# Patient Record
Sex: Female | Born: 1955 | Race: Black or African American | Hispanic: No | Marital: Married | Smoking: Never smoker
Health system: Southern US, Community
[De-identification: ages and names within clinical notes are randomized; demographics above are authoritative.]

## PROBLEM LIST (undated history)

## (undated) DIAGNOSIS — M069 Rheumatoid arthritis, unspecified: Secondary | ICD-10-CM

## (undated) DIAGNOSIS — M1990 Unspecified osteoarthritis, unspecified site: Secondary | ICD-10-CM

## (undated) DIAGNOSIS — Z8719 Personal history of other diseases of the digestive system: Secondary | ICD-10-CM

## (undated) DIAGNOSIS — Z8711 Personal history of peptic ulcer disease: Secondary | ICD-10-CM

## (undated) DIAGNOSIS — I10 Essential (primary) hypertension: Secondary | ICD-10-CM

## (undated) HISTORY — PX: TUBAL LIGATION: SHX77

## (undated) HISTORY — DX: Rheumatoid arthritis, unspecified: M06.9

## (undated) HISTORY — PX: JOINT REPLACEMENT: SHX530

---

## 2000-06-06 ENCOUNTER — Encounter: Payer: Self-pay | Admitting: Family Medicine

## 2000-06-06 ENCOUNTER — Ambulatory Visit (HOSPITAL_COMMUNITY): Admission: RE | Admit: 2000-06-06 | Discharge: 2000-06-06 | Payer: Self-pay | Admitting: Family Medicine

## 2002-04-07 ENCOUNTER — Emergency Department (HOSPITAL_COMMUNITY): Admission: EM | Admit: 2002-04-07 | Discharge: 2002-04-07 | Payer: Self-pay | Admitting: Emergency Medicine

## 2015-03-09 ENCOUNTER — Ambulatory Visit (INDEPENDENT_AMBULATORY_CARE_PROVIDER_SITE_OTHER): Payer: 59 | Admitting: Family Medicine

## 2015-03-09 ENCOUNTER — Ambulatory Visit (INDEPENDENT_AMBULATORY_CARE_PROVIDER_SITE_OTHER): Payer: 59

## 2015-03-09 VITALS — BP 156/90 | HR 77 | Temp 98.1°F | Resp 16 | Ht 64.0 in | Wt 154.0 lb

## 2015-03-09 DIAGNOSIS — M7989 Other specified soft tissue disorders: Secondary | ICD-10-CM | POA: Diagnosis not present

## 2015-03-09 DIAGNOSIS — M79642 Pain in left hand: Secondary | ICD-10-CM

## 2015-03-09 DIAGNOSIS — IMO0001 Reserved for inherently not codable concepts without codable children: Secondary | ICD-10-CM

## 2015-03-09 DIAGNOSIS — M25561 Pain in right knee: Secondary | ICD-10-CM

## 2015-03-09 DIAGNOSIS — Z1389 Encounter for screening for other disorder: Secondary | ICD-10-CM

## 2015-03-09 DIAGNOSIS — M25531 Pain in right wrist: Secondary | ICD-10-CM

## 2015-03-09 DIAGNOSIS — R030 Elevated blood-pressure reading, without diagnosis of hypertension: Secondary | ICD-10-CM

## 2015-03-09 DIAGNOSIS — M79641 Pain in right hand: Secondary | ICD-10-CM

## 2015-03-09 NOTE — Progress Notes (Signed)
Subjective:    Patient ID: Cindy Stevens, female    DOB: [birth year], 59 y.o.   MRN: [id number]  Chief Complaint  Patient presents with  . Joint Pain    X 3 weeks, bilateral hands and right knee  . Tingling    Off and on right foot   There are no active problems to display for this patient.  Prior to Admission medications   Not on File   Medications, allergies, past medical history, surgical history, family history, social history and problem list reviewed and updated.  HPI  76 yof with no significant pmh presents with bilateral hand pain, right knee pain, right big toe tingling.   Hand pain - started bilaterally approx 3 wks ago. Feels hands are stiff and tender 1st thing in morning, improves throughout day. Has tried aleve, advil with no relief. No trauma. She is a Programmer, applications for living, not doing any new tasks she hasn't done for many yrs. Feels that both hands are slightly weaker than normal. Pain is worst over knuckles.   Knee pain - Also started approx 3 wks ago. Has been bothering her constantly during past few wks. No trauma. No fever, chills. When she is at work on feet all day it actually improves, worst when she is at rest. Feels that pain is worst on inside of knee. No trauma to area. Aleve/advil/heating pad with no relief.   No fam hx oa, ra.   Also mentions that inside of her big toe has felt tingly past couple days. Comes and goes. No new shoes. No new activities. Denies pain, numbness, weakness to area. No other tingling sensation elsewhere. She is not overly concerned about this. Has had in the past and has resolved on own.   BP mildly elevated today. Pt states had stressful day at work and rushed here. Checks it occasionally at pharmacy and runs normal.   Review of Systems No cp, sob, fever, chills.     Objective:   Physical Exam  Constitutional: She is oriented to person, place, and time. She appears well-developed and well-nourished.  Non-toxic  appearance. She does not have a sickly appearance. She does not appear ill. No distress.  BP 156/90 mmHg  Pulse 77  Temp(Src) 98.1 F (36.7 C) (Oral)  Resp 16  Ht 5\' 4"  (1.626 m)  Wt 154 lb (69.854 kg)  BMI 26.42 kg/m2  SpO2 100%   Musculoskeletal:       Right knee: She exhibits normal range of motion, no swelling, no effusion, no LCL laxity, no bony tenderness, normal meniscus and no MCL laxity. Tenderness found. Medial joint line tenderness noted. No lateral joint line, no MCL, no LCL and no patellar tendon tenderness noted.       Right hand: She exhibits decreased range of motion, tenderness, bony tenderness and deformity. She exhibits normal capillary refill. Normal sensation noted. Decreased strength noted.       Hands: TTP over multiple PIP, MCP joints. No TTP over DIP joints. Mild swelling over 2nd, 3rd digit MCP joints. Decreased rom due to pain. Normal strength, normal sensation, normal cap refill. Right 5th digit with flexion deformity at DIP joint.   Left hand with mild TTP over MCP joints. Normal rom. Normal strength, sensation testing.   Right knee with ttp over medial joint line. No other ttp. Normal rom, strength, sensation. Neg lachmanns, neg valgus/varus testing. Neg grind test.   Neurological: She is alert and oriented to person, place, and time.  [facility] reading (PRIMARY) by Dr. Katrinka Blazing. Right hand findings: Degenerative change at DIP 5th joint with deviation. Otherwise normal.  Left hand findings: Normal.  Right knee findings: Mild degenerative changes medial knee. Patellar spurring.      Assessment & Plan:   84 yof with no significant pmh presents with bilateral hand pain, right knee pain, right big toe tingling.   Bilateral hand pain - Plan: DG Hand 2 View Right, DG Hand 2 View Left, Rheumatoid factor, Cyclic Citrul Peptide Antibody, IGG, Sedimentation rate, ANA Bilateral hand swelling - Plan: DG Hand 2 View Right, DG Hand 2 View Left, Rheumatoid factor, Cyclic  Citrul Peptide Antibody, IGG, Sedimentation rate, ANA --oa vs ra --xrays not concerning --> mild degenerative change at right 5th DIP, await ra labs --start with 500 tylenol q6, pt has only been using prn aleve/advil at this time --rtc if no relief with tylenol for further step up therapy --rtc if right medial big toe tingling persists  Right medial knee pain - Plan: DG Knee Complete 4 Views Right --mild degenerative changes --tylenol as above, rtc as above --await lab results  Elevated BP - Plan: Comprehensive metabolic panel --pt states typically runs 120s/80s, stressed from work today and rushed in here --rtc if running >140/90  Screening for nephropathy - Plan: Comprehensive metabolic panel --screen liver function prior to starting tylenol therapy for possible oa  Donnajean Lopes, PA-C Physician Assistant-Certified [facility]  03/09/2015 5:38 PM

## 2015-03-09 NOTE — Patient Instructions (Signed)
Your xrays showed some mild arthritis. We drew some labs to check for possible rheumatoid arthritis. I will let you know when these labs return.  Please start taking tylenol 500 mg every 4-6 hours for the arthritis. Be sure not to take more than 3000 mg total of tylenol. If your labs come back negative for RA, and if your pain persists despite the tylenol, please come back to see [place] for further management.   Arthritis, Nonspecific Arthritis is inflammation of a joint. This usually means pain, redness, warmth or swelling are present. One or more joints may be involved. There are a number of types of arthritis. Your caregiver may not be able to tell what type of arthritis you have right away. CAUSES  The most common cause of arthritis is the wear and tear on the joint (osteoarthritis). This causes damage to the cartilage, which can break down over time. The knees, hips, back and neck are most often affected by this type of arthritis. Other types of arthritis and common causes of joint pain include:  Sprains and other injuries near the joint. Sometimes minor sprains and injuries cause pain and swelling that develop hours later.  Rheumatoid arthritis. This affects hands, feet and knees. It usually affects both sides of your body at the same time. It is often associated with chronic ailments, fever, weight loss and general weakness.  Crystal arthritis. Gout and pseudo gout can cause occasional acute severe pain, redness and swelling in the foot, ankle, or knee.  Infectious arthritis. Bacteria can get into a joint through a break in overlying skin. This can cause infection of the joint. Bacteria and viruses can also spread through the blood and affect your joints.  Drug, infectious and allergy reactions. Sometimes joints can become mildly painful and slightly swollen with these types of illnesses. SYMPTOMS   Pain is the main symptom.  Your joint or joints can also be red, swollen and warm or hot to  the touch.  You may have a fever with certain types of arthritis, or even feel overall ill.  The joint with arthritis will hurt with movement. Stiffness is present with some types of arthritis. DIAGNOSIS  Your caregiver will suspect arthritis based on your description of your symptoms and on your exam. Testing may be needed to find the type of arthritis:  Blood and sometimes urine tests.  X-ray tests and sometimes CT or MRI scans.  Removal of fluid from the joint (arthrocentesis) is done to check for bacteria, crystals or other causes. Your caregiver (or a specialist) will numb the area over the joint with a local anesthetic, and use a needle to remove joint fluid for examination. This procedure is only minimally uncomfortable.  Even with these tests, your caregiver may not be able to tell what kind of arthritis you have. Consultation with a specialist (rheumatologist) may be helpful. TREATMENT  Your caregiver will discuss with you treatment specific to your type of arthritis. If the specific type cannot be determined, then the following general recommendations may apply. Treatment of severe joint pain includes:  Rest.  Elevation.  Anti-inflammatory medication (for example, ibuprofen) may be prescribed. Avoiding activities that cause increased pain.  Only take over-the-counter or prescription medicines for pain and discomfort as recommended by your caregiver.  Cold packs over an inflamed joint may be used for 10 to 15 minutes every hour. Hot packs sometimes feel better, but do not use overnight. Do not use hot packs if you are diabetic without your  caregiver's permission.  A cortisone shot into arthritic joints may help reduce pain and swelling.  Any acute arthritis that gets worse over the next 1 to 2 days needs to be looked at to be sure there is no joint infection. Long-term arthritis treatment involves modifying activities and lifestyle to reduce joint stress jarring. This can  include weight loss. Also, exercise is needed to nourish the joint cartilage and remove waste. This helps keep the muscles around the joint strong. HOME CARE INSTRUCTIONS   Do not take aspirin to relieve pain if gout is suspected. This elevates uric acid levels.  Only take over-the-counter or prescription medicines for pain, discomfort or fever as directed by your caregiver.  Rest the joint as much as possible.  If your joint is swollen, keep it elevated.  Use crutches if the painful joint is in your leg.  Drinking plenty of fluids may help for certain types of arthritis.  Follow your caregiver's dietary instructions.  Try low-impact exercise such as:  Swimming.  Water aerobics.  Biking.  Walking.  Morning stiffness is often relieved by a warm shower.  Put your joints through regular range-of-motion. SEEK MEDICAL CARE IF:   You do not feel better in 24 hours or are getting worse.  You have side effects to medications, or are not getting better with treatment. SEEK IMMEDIATE MEDICAL CARE IF:   You have a fever.  You develop severe joint pain, swelling or redness.  Many joints are involved and become painful and swollen.  There is severe back pain and/or leg weakness.  You have loss of bowel or bladder control. Document Released: 01/16/2005 Document Revised: 03/02/2012 Document Reviewed: 02/01/2009 [facility] Patient Information 2015 [place], [place]. This information is not intended to replace advice given to you by your health care provider. Make sure you discuss any questions you have with your health care provider.

## 2015-03-10 LAB — COMPREHENSIVE METABOLIC PANEL
ALT: 11 U/L (ref 0–35)
AST: 14 U/L (ref 0–37)
Albumin: 4 g/dL (ref 3.5–5.2)
Alkaline Phosphatase: 111 U/L (ref 39–117)
BUN: 14 mg/dL (ref 6–23)
CALCIUM: 9.1 mg/dL (ref 8.4–10.5)
CHLORIDE: 104 meq/L (ref 96–112)
CO2: 24 meq/L (ref 19–32)
Creat: 0.67 mg/dL (ref 0.50–1.10)
Glucose, Bld: 85 mg/dL (ref 70–99)
POTASSIUM: 4.2 meq/L (ref 3.5–5.3)
Sodium: 139 mEq/L (ref 135–145)
Total Bilirubin: 0.2 mg/dL (ref 0.2–1.2)
Total Protein: 8 g/dL (ref 6.0–8.3)

## 2015-03-10 LAB — RHEUMATOID FACTOR: Rhuematoid fact SerPl-aCnc: <10 IU/mL (ref <=14)

## 2015-03-10 NOTE — Progress Notes (Signed)
History and physical examinations reviewed in detail with PA McVeigh.  Xrays reviewed during visit.  Agree with assessment and plan. Cindy Stevens Paulita Fujita, M.D. [facility] [address] [place], [place]  [address] [phone] phone [phone] fax

## 2015-03-11 LAB — SEDIMENTATION RATE: Sed Rate: 49 mm/hr — ABNORMAL HIGH (ref 0–30)

## 2015-03-13 ENCOUNTER — Telehealth: Payer: Self-pay

## 2015-03-13 LAB — CYCLIC CITRUL PEPTIDE ANTIBODY, IGG: Cyclic Citrullin Peptide Ab: <2 U/mL (ref 0.0–5.0)

## 2015-03-13 NOTE — Telephone Encounter (Signed)
Dr. Katrinka Blazing, pt calling about labs. Please review. Thanks

## 2015-03-14 ENCOUNTER — Telehealth: Payer: Self-pay

## 2015-03-14 DIAGNOSIS — M150 Primary generalized (osteo)arthritis: Principal | ICD-10-CM

## 2015-03-14 DIAGNOSIS — M159 Polyosteoarthritis, unspecified: Secondary | ICD-10-CM

## 2015-03-14 LAB — ANA: Anti Nuclear Antibody(ANA): NEGATIVE

## 2015-03-14 NOTE — Telephone Encounter (Signed)
Patient is calling to speak to Dr. Neva Seat in regards to lab results. Please call! [phone]

## 2015-03-14 NOTE — Telephone Encounter (Signed)
Unable to reach pt

## 2015-03-15 NOTE — Telephone Encounter (Signed)
Bilateral hand swelling - Plan: DG Hand 2 View Right, DG Hand 2 View Left, Rheumatoid factor, Cyclic Citrul Peptide Antibody, IGG, Sedimentation rate, ANA --oa vs ra --xrays not concerning --> mild degenerative change at right 5th DIP, await ra labs --start with 500 tylenol q6, pt has only been using prn aleve/advil at this time --rtc if no relief with tylenol for further step up therapy --rtc if right medial big toe tingling persists  Can we answer her question about what could be causing the hand swelling? Please advise and can we refer to Dr. Kellie Simmering?

## 2015-03-15 NOTE — Telephone Encounter (Signed)
Patient called stating she would like a referral to see a doctor about her arthritis. She has a doctor in mind in [place], Dr. Stacey Drain at [address], since its close to her home. Can she get referral done? Cb# [id number].

## 2015-03-15 NOTE — Telephone Encounter (Signed)
Pt was read a letter about her lab results and was told that the swelling in her hand was not due to arthritis.  She wants to know if that is not what it is, what could be causing this?  Also can we call her in a prescription for the swelling?

## 2015-03-16 NOTE — Telephone Encounter (Signed)
Left msg with pt. Swelling most likely secondary to OA. Her RA labs were negative but she most likely has some osteoarthritis. Instructed her she should continue to take the tylenol for the swelling. I will gladly refer her to ortho for further management. I will refer to the listed physician.

## 2015-03-16 NOTE — Telephone Encounter (Signed)
Patient evaluated by Raelyn Ensign, PA-C. Will forward to PA McVeigh for management.

## 2015-03-16 NOTE — Telephone Encounter (Signed)
Patient advised of labs results on 03/15/15; labs reviewed by Raelyn Ensign, PA-C.

## 2016-08-22 ENCOUNTER — Other Ambulatory Visit: Payer: Self-pay | Admitting: Obstetrics and Gynecology

## 2016-08-22 ENCOUNTER — Other Ambulatory Visit (HOSPITAL_COMMUNITY)
Admission: RE | Admit: 2016-08-22 | Discharge: 2016-08-22 | Disposition: A | Discharge status: Home / Self Care | Payer: 59 | Source: Ambulatory Visit | Attending: Obstetrics and Gynecology | Admitting: Obstetrics and Gynecology

## 2016-08-22 DIAGNOSIS — Z01419 Encounter for gynecological examination (general) (routine) without abnormal findings: Secondary | ICD-10-CM | POA: Insufficient documentation

## 2016-08-22 DIAGNOSIS — Z1151 Encounter for screening for human papillomavirus (HPV): Secondary | ICD-10-CM | POA: Insufficient documentation

## 2016-08-27 LAB — CYTOLOGY - PAP

## 2016-11-16 ENCOUNTER — Ambulatory Visit (HOSPITAL_BASED_OUTPATIENT_CLINIC_OR_DEPARTMENT_OTHER)
Admission: RE | Admit: 2016-11-16 | Discharge: 2016-11-16 | Disposition: A | Discharge status: Home / Self Care | Payer: 59 | Source: Ambulatory Visit | Attending: Family Medicine | Admitting: Family Medicine

## 2016-11-16 ENCOUNTER — Ambulatory Visit (INDEPENDENT_AMBULATORY_CARE_PROVIDER_SITE_OTHER): Payer: 59

## 2016-11-16 ENCOUNTER — Telehealth: Payer: Self-pay

## 2016-11-16 ENCOUNTER — Telehealth: Payer: Self-pay | Admitting: Family Medicine

## 2016-11-16 ENCOUNTER — Ambulatory Visit (INDEPENDENT_AMBULATORY_CARE_PROVIDER_SITE_OTHER): Payer: 59 | Admitting: Family Medicine

## 2016-11-16 VITALS — BP 110/70 | HR 91 | Temp 98.6°F | Resp 17 | Ht 64.0 in | Wt 150.0 lb

## 2016-11-16 DIAGNOSIS — R0781 Pleurodynia: Secondary | ICD-10-CM | POA: Insufficient documentation

## 2016-11-16 DIAGNOSIS — R918 Other nonspecific abnormal finding of lung field: Secondary | ICD-10-CM | POA: Diagnosis not present

## 2016-11-16 LAB — POCT URINALYSIS DIP (MANUAL ENTRY)
BILIRUBIN UA: NEGATIVE
BILIRUBIN UA: NEGATIVE
Glucose, UA: NEGATIVE
NITRITE UA: NEGATIVE
Protein Ur, POC: 30 — AB
Spec Grav, UA: 1.025
Urobilinogen, UA: 1
pH, UA: 6

## 2016-11-16 LAB — POC MICROSCOPIC URINALYSIS (UMFC)

## 2016-11-16 MED ORDER — NAPROXEN 500 MG PO TABS: 500.0000 mg | ORAL_TABLET | Freq: Two times a day (BID) | ORAL | 0 refills | Status: DC

## 2016-11-16 MED ORDER — TRAMADOL HCL 50 MG PO TABS: 50.0000 mg | ORAL_TABLET | Freq: Three times a day (TID) | ORAL | 0 refills | Status: DC | PRN

## 2016-11-16 NOTE — Telephone Encounter (Signed)
I called to get PA on ct scan 11/16/16 and message states they are closed call Monday morning. Please do PA Monday.

## 2016-11-16 NOTE — Telephone Encounter (Addendum)
Received call from CT tech at [facility] regarding results for CT scan.  Dg Ribs Unilateral W/chest Left  Result Date: 11/16/2016 CLINICAL DATA:  60 year old female with left anterior rib pain EXAM: LEFT RIBS AND CHEST - 3+ VIEW COMPARISON:  None. FINDINGS: Cardiomediastinal silhouette within normal limits. No confluent airspace disease. Blunting of the left costophrenic angle, with the oblique images demonstrating thickening of the left pleura in the region of the fiducial marker. No displaced fracture. IMPRESSION: No evidence of displaced rib fracture. There is nonspecific thickening of the left-sided pleura at the costophrenic angle and lateral chest in the region of the fiducial marker, possibly pleural effusion or hemorrhage. Interval follow-up recommended with further evaluation with chest CT. These results were called by telephone at the time of interpretation on 11/16/2016 at 10:16 am to assistant for PA Ms Joaquin Courts , Mr Gavin Pound, who verbally acknowledged these results. Signed, Yvone Neu. Loreta Ave, DO [facility] Electronically Signed   By: Gilmer Mor D.O.   On: 11/16/2016 10:17   Ct Chest Wo Contrast  Result Date: 11/16/2016 CLINICAL DATA:  Pt with left lateral rib pain, NKI, pain with deep breath. EXAM: CT CHEST WITHOUT CONTRAST TECHNIQUE: Multidetector CT imaging of the chest was performed following the standard protocol without IV contrast. COMPARISON:  Chest x-ray from earlier same day. FINDINGS: Cardiovascular: Thoracic aorta is normal in caliber. Heart size is normal. No pericardial effusion. Mediastinum/Nodes: No mass or enlarged lymph nodes identified within the mediastinum, perihilar or axillary regions. Esophagus appears normal. Trachea and central bronchi are unremarkable. Lungs/Pleura: Peripheral consolidation at the left lung base, most prominent along the posterior medial aspects of the left lower lobe Vernona Rieger,  most likely atelectasis and chronic pleural thickening. Lungs otherwise clear bilaterally. Upper Abdomen: Limited images of the upper abdomen are unremarkable. Musculoskeletal: No acute or suspicious osseous finding. Superficial soft tissues are unremarkable. IMPRESSION: 1. Small peripheral consolidation at the left lung base, most suggestive of chronic benign pleural thickening and atelectasis, perhaps related to previous infectious or inflammatory process. Recommend follow-up chest CT in 3-6 months to ensure stability and confirm benignity. Lungs otherwise clear. 2. No evidence of acute intrathoracic abnormality. Left-sided ribs appear normal. Electronically Signed   By: Bary Richard M.D.   On: 11/16/2016 11:36

## 2016-11-16 NOTE — Progress Notes (Signed)
Patient ID: Cindy Stevens, female    DOB: November 07, 1956, 60 y.o.   MRN: [id number]  PCP: Joaquin Courts, FNP  Chief Complaint  Patient presents with  . Flank Pain    Left. NKI.     Subjective:   HPI 60 year old female presents for evaluation of left rib pain times 3 weeks. Patient has been previously seen here at [facility]. Reports aching, with occasional sharp pain upper section of left rib pain. Denies recent injury, falls or illness. Deep breathing and coughing increases rib pain. Pain produced when lying down. Last night she report worsening rib pain that was not relieved with anti-inflammatories.   Social History   Social History  . Marital status: Married    Spouse name: N/A  . Number of children: N/A  . Years of education: N/A   Occupational History  . Not on file.   Social History Main Topics  . Smoking status: Never Smoker  . Smokeless tobacco: Not on file  . Alcohol use No  . Drug use: No  . Sexual activity: Not on file   Other Topics Concern  . Not on file   Social History Narrative  . No narrative on file    Family History  Problem Relation Age of Onset  . Heart disease Sister   . Diabetes Sister    Review of Systems See HPI There are no active problems to display for this patient.    Prior to Admission medications   Medication Sig Start Date End Date Taking? Authorizing Provider  hydrochlorothiazide (HYDRODIURIL) 25 MG tablet Take 25 mg by mouth daily.   Yes Historical Provider, MD  No Known Allergies    Objective:  Physical Exam  Constitutional: She is oriented to person, place, and time. She appears well-developed and well-nourished.  Cardiovascular: Normal rate, regular rhythm and normal heart sounds.   Pulmonary/Chest: Effort normal and breath sounds normal. She exhibits no tenderness.  Unable to reproduce pain in left rib with deep palpation   Musculoskeletal: Normal range of motion.  Neurological: She is alert and oriented to  person, place, and time.  Skin: Skin is warm and dry.  Psychiatric: She has a normal mood and affect. Her behavior is normal. Judgment and thought content normal.    Vitals:   11/16/16 0857  BP: 110/70  Pulse: 91  Resp: 17  Temp: 98.6 F (37 C)   Dg Ribs Unilateral W/chest Left  Result Date: 11/16/2016 CLINICAL DATA:  60 year old female with left anterior rib pain EXAM: LEFT RIBS AND CHEST - 3+ VIEW COMPARISON:  None. FINDINGS: Cardiomediastinal silhouette within normal limits. No confluent airspace disease. Blunting of the left costophrenic angle, with the oblique images demonstrating thickening of the left pleura in the region of the fiducial marker. No displaced fracture. IMPRESSION: No evidence of displaced rib fracture. There is nonspecific thickening of the left-sided pleura at the costophrenic angle and lateral chest in the region of the fiducial marker, possibly pleural effusion or hemorrhage. Interval follow-up recommended with further evaluation with chest CT. These results were called by telephone at the time of interpretation on 11/16/2016 at 10:16 am to assistant for PA Ms Joaquin Courts , Mr Gavin Pound, who verbally acknowledged these results. Signed, Yvone Neu. Loreta Ave, DO [facility] Electronically Signed   By: Gilmer Mor D.O.   On: 11/16/2016 10:17   Ct Chest Wo Contrast  Result Date: 11/16/2016 CLINICAL DATA:  Pt with left lateral rib pain,  NKI, pain with deep breath. EXAM: CT CHEST WITHOUT CONTRAST TECHNIQUE: Multidetector CT imaging of the chest was performed following the standard protocol without IV contrast. COMPARISON:  Chest x-ray from earlier same day. FINDINGS: Cardiovascular: Thoracic aorta is normal in caliber. Heart size is normal. No pericardial effusion. Mediastinum/Nodes: No mass or enlarged lymph nodes identified within the mediastinum, perihilar or axillary regions. Esophagus appears normal. Trachea  and central bronchi are unremarkable. Lungs/Pleura: Peripheral consolidation at the left lung base, most prominent along the posterior medial aspects of the left lower lobe Vernona Rieger, most likely atelectasis and chronic pleural thickening. Lungs otherwise clear bilaterally. Upper Abdomen: Limited images of the upper abdomen are unremarkable. Musculoskeletal: No acute or suspicious osseous finding. Superficial soft tissues are unremarkable. IMPRESSION: 1. Small peripheral consolidation at the left lung base, most suggestive of chronic benign pleural thickening and atelectasis, perhaps related to previous infectious or inflammatory process. Recommend follow-up chest CT in 3-6 months to ensure stability and confirm benignity. Lungs otherwise clear. 2. No evidence of acute intrathoracic abnormality. Left-sided ribs appear normal. Electronically Signed   By: Bary Richard M.D.   On: 11/16/2016 11:36   Assessment & Plan:  1. Rib pain on left side - POCT urinalysis dipstick - POCT Microscopic Urinalysis ([facility]) - DG Ribs Unilateral W/Chest Left; Future  Plan: - CT Chest Wo Contrast, STAT  For pain management -Tramadol 50 mg, every 8 hours needed. - Ambulatory referral to Pulmonology, Urgent  Follow-up as needed.  Godfrey Pick. Tiburcio Pea, MSN, FNP-C [facility]

## 2016-11-16 NOTE — Patient Instructions (Addendum)
You have what is likely pleural effusion vs hemorrhage.  GO TO [facility] NOW FOR YOUR CT SCAN   [address] [place], [place] [address]   IF you received an x-ray today, you will receive an invoice from [facility]. Please contact [facility] at [phone] with questions or concerns regarding your invoice.   IF you received labwork today, you will receive an invoice from [employer]. Please contact [facility] at [phone] with questions or concerns regarding your invoice.   Our billing staff will not be able to assist you with questions regarding bills from these companies.  You will be contacted with the lab results as soon as they are available. The fastest way to get your results is to activate your My Chart account. Instructions are located on the last page of this paperwork. If you have not heard from [place] regarding the results in 2 weeks, please contact this office.     We recommend that you schedule a mammogram for breast cancer screening. Typically, you do not need a referral to do this. Please contact a local imaging center to schedule your mammogram.  [facility] - [phone]  *ask for the Radiology Department [facility]) - [phone] or [phone]  [facility] - [phone] [facility] - [phone] [facility] - [phone]  *ask for the Radiology Department [facility] - [phone]  *ask for the Radiology Department [facility] - [phone]  *ask for the Mammography Department [facility] - [phone]

## 2016-11-17 NOTE — Telephone Encounter (Deleted)
Spoke with Cindy Stevens and Cindy Stevens and advised of CT scan results showed nothing acute. Although chronic pleural thickening note along the base of the left lung. Advised I will treat acute left rib pain with anti-inflammatory, Naproxen 500 mg twice daily with food and Tramadol 50 mg every 8 hours as needed for pain. Will place an urgent referral to pulmonology for further evaluation.    Dg Ribs Unilateral W/chest Left  Result Date: 11/16/2016 CLINICAL DATA:  60 year old female with left anterior rib pain EXAM: LEFT RIBS AND CHEST - 3+ VIEW COMPARISON:  None. FINDINGS: Cardiomediastinal silhouette within normal limits. No confluent airspace disease. Blunting of the left costophrenic angle, with the oblique images demonstrating thickening of the left pleura in the region of the fiducial marker. No displaced fracture. IMPRESSION: No evidence of displaced rib fracture. There is nonspecific thickening of the left-sided pleura at the costophrenic angle and lateral chest in the region of the fiducial marker, possibly pleural effusion or hemorrhage. Interval follow-up recommended with further evaluation with chest CT. These results were called by telephone at the time of interpretation on 11/16/2016 at 10:16 am to assistant for PA Cindy Stevens Joaquin Courts , Mr Gavin Pound, who verbally acknowledged these results. Signed, Yvone Neu. Loreta Ave, DO [facility] Electronically Signed   By: Gilmer Mor D.O.   On: 11/16/2016 10:17   Ct Chest Wo Contrast  Result Date: 11/16/2016 CLINICAL DATA:  Pt with left lateral rib pain, NKI, pain with deep breath. EXAM: CT CHEST WITHOUT CONTRAST TECHNIQUE: Multidetector CT imaging of the chest was performed following the standard protocol without IV contrast. COMPARISON:  Chest x-ray from earlier same day. FINDINGS: Cardiovascular: Thoracic aorta is normal in caliber. Heart size is normal. No pericardial effusion. Mediastinum/Nodes: No  mass or enlarged lymph nodes identified within the mediastinum, perihilar or axillary regions. Esophagus appears normal. Trachea and central bronchi are unremarkable. Lungs/Pleura: Peripheral consolidation at the left lung base, most prominent along the posterior medial aspects of the left lower lobe Vernona Rieger, most likely atelectasis and chronic pleural thickening. Lungs otherwise clear bilaterally. Upper Abdomen: Limited images of the upper abdomen are unremarkable. Musculoskeletal: No acute or suspicious osseous finding. Superficial soft tissues are unremarkable. IMPRESSION: 1. Small peripheral consolidation at the left lung base, most suggestive of chronic benign pleural thickening and atelectasis, perhaps related to previous infectious or inflammatory process. Recommend follow-up chest CT in 3-6 months to ensure stability and confirm benignity. Lungs otherwise clear. 2. No evidence of acute intrathoracic abnormality. Left-sided ribs appear normal. Electronically Signed   By: Bary Richard M.D.   On: 11/16/2016 11:36

## 2016-11-18 NOTE — Telephone Encounter (Signed)
Submitted online, currently pending. Will request peer-to-peer if needed.

## 2016-11-19 NOTE — Telephone Encounter (Signed)
[employer] is requiring peer-to-peer review. Clinical needs to call [phone], reference case number [id number].

## 2016-11-19 NOTE — Telephone Encounter (Signed)
Cindy Stevens, Normally the RNs could handle providing additional clinical information, however, they prefer ordering provider do the Peer to Peer reviews.   Thanks,

## 2016-11-20 NOTE — Telephone Encounter (Signed)
[facility] Department  [phone]  Option #3  Correct number for peer to peer review. Placed on hold for 28 minutes and was routed to the incorrect department.

## 2016-11-22 ENCOUNTER — Encounter: Payer: Self-pay | Admitting: Pulmonary Disease

## 2016-11-22 ENCOUNTER — Ambulatory Visit (INDEPENDENT_AMBULATORY_CARE_PROVIDER_SITE_OTHER): Payer: 59 | Admitting: Pulmonary Disease

## 2016-11-22 VITALS — BP 128/74 | HR 86 | Ht 62.0 in | Wt 153.2 lb

## 2016-11-22 DIAGNOSIS — J929 Pleural plaque without asbestos: Secondary | ICD-10-CM

## 2016-11-22 DIAGNOSIS — M069 Rheumatoid arthritis, unspecified: Secondary | ICD-10-CM | POA: Diagnosis not present

## 2016-11-22 NOTE — Patient Instructions (Signed)
Will arrange for follow up CT chest in May 2018 and schedule follow up after this

## 2016-11-22 NOTE — Progress Notes (Signed)
   Subjective:    Patient ID: Cindy Stevens, female    DOB: [birth year], 60 y.o.   MRN: [id number]  HPI    Review of Systems  Constitutional: Negative for fever and unexpected weight change.  HENT: Negative for congestion, dental problem, ear pain, nosebleeds, postnasal drip, rhinorrhea, sinus pressure, sneezing, sore throat and trouble swallowing.   Eyes: Negative for redness and itching.  Respiratory: Negative for cough, chest tightness, shortness of breath and wheezing.   Cardiovascular: Negative for palpitations and leg swelling.  Gastrointestinal: Negative for nausea and vomiting.  Genitourinary: Negative for dysuria.  Musculoskeletal: Negative for joint swelling.  Skin: Negative for rash.  Neurological: Negative for headaches.  Hematological: Does not bruise/bleed easily.  Psychiatric/Behavioral: Negative for dysphoric mood. The patient is not nervous/anxious.        Objective:   Physical Exam        Assessment & Plan:

## 2016-11-22 NOTE — Progress Notes (Signed)
Past surgical history She  has no past surgical history on file.  Family history Her family history includes Diabetes in her sister; Heart disease in her sister.  Social history She  reports that she has never smoked. She has never used smokeless tobacco. She reports that she does not drink alcohol or use drugs.  No Known Allergies    Review of Systems  Constitutional: Negative for fever and unexpected weight change.  HENT: Negative for congestion, dental problem, ear pain, nosebleeds, postnasal drip, rhinorrhea, sinus pressure, sneezing, sore throat and trouble swallowing.   Eyes: Negative for redness and itching.  Respiratory: Negative for cough, chest tightness, shortness of breath and wheezing.   Cardiovascular: Negative for palpitations and leg swelling.  Gastrointestinal: Negative for nausea and vomiting.  Genitourinary: Negative for dysuria.  Musculoskeletal: Negative for joint swelling.  Skin: Negative for rash.  Neurological: Negative for headaches.  Hematological: Does not bruise/bleed easily.  Psychiatric/Behavioral: Negative for dysphoric mood. The patient is not nervous/anxious.     Current Outpatient Prescriptions on File Prior to Visit  Medication Sig  . hydrochlorothiazide (HYDRODIURIL) 25 MG tablet Take 25 mg by mouth daily.  . naproxen (NAPROSYN) 500 MG tablet Take 1 tablet (500 mg total) by mouth 2 (two) times daily with a meal.  . traMADol (ULTRAM) 50 MG tablet Take 1 tablet (50 mg total) by mouth every 8 (eight) hours as needed.   No current facility-administered medications on file prior to visit.     Chief Complaint  Patient presents with  . PULMONARY CONSULT    per Dr. Tiburcio Pea. pt c/o left rib pain X 2 wk. pt denies any sob, wheezing or chest discomfort.    Pulmonary tests CT chest 11/16/16 >> left lower lung pleural thickening  Past medical history She  has a past medical history of Rheumatoid arthritis (HCC).  Vital signs BP 128/74 (BP  Location: Left Arm, Cuff Size: Normal)   Pulse 86   Ht 5\' 2"  (1.575 m)   Wt 153 lb 3.2 oz (69.5 kg)   SpO2 100%   BMI 28.02 kg/m   History of present illness Cindy Stevens is a 60 y.o. female with left sided chest pain.  She has history of rheumatoid arthritis, and is followed by Dr. 67.  She is on an injectable biologic agent for this.  She developed pain in her left chest about 4 weeks ago.  It would hurt when she coughed or would take a deep breath.  She was not having fever, skin rash, sputum, hemoptysis, or wheezing.  She denies joint swelling.    She denies sick exposures.  She never smoked.  No history of pneumonia or exposure to tuberculosis.  She denies animal/bird exposures.  She had CT chest done which showed faint consolidation and pleural thickening in LLL.  She was started on naprosyn, and feels this has helped.   Physical exam  General - No distress ENT - No sinus tenderness, no oral exudate, no LAN, no thyromegaly, TM clear, pupils equal/reactive Cardiac - s1s2 regular, no murmur, pulses symmetric Chest - No wheeze/rales/dullness, good air entry, normal respiratory excursion Back - No focal tenderness Abd - Soft, non-tender, no organomegaly, + bowel sounds Ext - No edema Neuro - Normal strength, cranial nerves intact Skin - No rashes Psych - Normal mood, and behavior   CMP Latest Ref Rng & Units 03/09/2015  Glucose 70 - 99 mg/dL 85  BUN 6 - 23 mg/dL 14  Creatinine 03/11/2015 - 1.02  mg/dL 3.71  Sodium 696 - 789 mEq/L 139  Potassium 3.5 - 5.3 mEq/L 4.2  Chloride 96 - 112 mEq/L 104  CO2 19 - 32 mEq/L 24  Calcium 8.4 - 10.5 mg/dL 9.1  Total Protein 6.0 - 8.3 g/dL 8.0  Total Bilirubin 0.2 - 1.2 mg/dL 0.2  Alkaline Phos 39 - 117 U/L 111  AST 0 - 37 U/L 14  ALT 0 - 35 U/L 11    Discussion 60 yo female with Lt pleuritic chest pain, Lt lower lung pleural thickening and faint consolidation, and hx of rheumatoid arthritis.  She likely either had a viral  respiratory infection that caused pleuritis or this could be related to rheumatoid arthritis.  She reports symptomatic improvement with NSAID therpay.   Assessment/plan  Lt sided pleuritis. - continue NSAID therapy - she is to call if symptoms get worse, and then will give trial of prednisone - she will need f/u CT chest w/o contrast in 6 months   Patient Instructions  Will arrange for follow up CT chest in May 2018 and schedule follow up after this    Coralyn Helling, MD [facility] Pager:  [phone] 11/22/2016, 3:37 PM

## 2016-12-11 ENCOUNTER — Institutional Professional Consult (permissible substitution): Payer: 59 | Admitting: Pulmonary Disease

## 2017-04-22 ENCOUNTER — Telehealth: Payer: Self-pay | Admitting: Pulmonary Disease

## 2017-04-22 NOTE — Telephone Encounter (Signed)
I just spoke with Cindy Stevens about scheduling this 6 month follow up CT that Dr. Craige Cotta ordered for her. She stated that the pain that she was having is no longer there and that she feels that the CT is unnecessary.

## 2017-04-23 NOTE — Telephone Encounter (Signed)
Noted  

## 2017-08-22 ENCOUNTER — Other Ambulatory Visit: Payer: Self-pay | Admitting: Family Medicine

## 2017-08-22 ENCOUNTER — Ambulatory Visit
Admission: RE | Admit: 2017-08-22 | Discharge: 2017-08-22 | Disposition: A | Discharge status: Home / Self Care | Payer: BLUE CROSS/BLUE SHIELD | Source: Ambulatory Visit | Attending: Family Medicine | Admitting: Family Medicine

## 2017-08-22 DIAGNOSIS — Z01818 Encounter for other preprocedural examination: Secondary | ICD-10-CM

## 2017-09-26 ENCOUNTER — Ambulatory Visit: Payer: Self-pay | Admitting: Orthopedic Surgery

## 2017-09-30 ENCOUNTER — Ambulatory Visit: Payer: Self-pay | Admitting: Orthopedic Surgery

## 2017-09-30 NOTE — H&P (Signed)
TOTAL HIP ADMISSION H&P  Patient is admitted for left total hip arthroplasty.  Subjective:  Chief Complaint: left hip pain  HPI: Cindy Stevens, 61 y.o. 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  Onset of symptoms was gradual starting 2 years ago with gradually worsening course since that time.The patient noted no past surgery on the left hip(s).  Patient currently rates pain in the left hip at 10 out of 10 with activity. Patient has night pain, worsening of pain with activity and weight bearing, trendelenberg gait, pain that interfers with activities of daily living, pain with passive range of motion and crepitus. Patient has evidence of joint space narrowing and AVN with collapse by imaging studies. This condition presents safety issues increasing the risk of falls. This patient has had avascular necrosis of the hip.  There is no current active infection.  There are no active problems to display for this patient.  Past Medical History:  Diagnosis Date  . Rheumatoid arthritis (HCC)     No past surgical history on file.   (Not in a hospital admission) No Known Allergies  Social History  Substance Use Topics  . Smoking status: Never Smoker  . Smokeless tobacco: Never Used  . Alcohol use No    Family History  Problem Relation Age of Onset  . Heart disease Sister   . Diabetes Sister      Review of Systems  Constitutional: Negative.   HENT: Negative.   Eyes: Negative.   Respiratory: Negative.   Cardiovascular: Negative.   Gastrointestinal: Negative.   Genitourinary: Negative.   Musculoskeletal: Positive for joint pain.  Skin: Negative.   Neurological: Negative.   Endo/Heme/Allergies:  Negative.   Psychiatric/Behavioral: Negative.     Objective:  Physical Exam  Vitals reviewed. Constitutional: She is oriented to person, place, and time. She appears well-developed and well-nourished.  HENT:  Head: Normocephalic and atraumatic.  Eyes: Pupils are equal, round, and reactive to light. Conjunctivae and EOM are normal.  Neck: Normal range of motion. Neck supple.  Cardiovascular: Normal rate, regular rhythm and intact distal pulses.   Respiratory: Effort normal. No respiratory distress.  GI: Soft. She exhibits no distension.  Genitourinary:  Genitourinary Comments: deferred  Musculoskeletal:       Left hip: She exhibits decreased range of motion, decreased strength, bony tenderness and crepitus.  Neurological: She is alert and oriented to person, place, and time. She has normal reflexes.  Skin: Skin is warm and dry.  Psychiatric: She has a normal mood and affect. Her behavior is normal. Judgment and thought content normal.    Vital signs in last 24 hours: @VSRANGES @  Labs:   Estimated body mass index is 28.02 kg/m as calculated from the following:   Height as of 11/22/16: 5\' 2"  (1.575 m).   Weight as of 11/22/16: 69.5 kg (153 lb 3.2 oz).   Imaging Review Plain radiographs demonstrate severe degenerative joint disease of the left hip(s). The bone quality appears to be adequate for age and reported activity level.  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 The  treatment options including medical management, injection therapy, arthroscopy and arthroplasty were discussed at length. The risks and benefits of total hip arthroplasty were presented and reviewed. The risks due to aseptic loosening, infection, stiffness, dislocation/subluxation,   thromboembolic complications and other imponderables were discussed.  The patient acknowledged the explanation, agreed to proceed with the plan and consent was signed. Patient is being admitted for inpatient treatment for surgery, pain control, PT, OT, prophylactic antibiotics, VTE prophylaxis, progressive ambulation and ADL's and discharge planning.The patient is planning to be discharged home with home health services

## 2017-10-03 ENCOUNTER — Encounter (HOSPITAL_COMMUNITY): Payer: Self-pay

## 2017-10-03 ENCOUNTER — Encounter (HOSPITAL_COMMUNITY)
Admission: RE | Admit: 2017-10-03 | Discharge: 2017-10-03 | Disposition: A | Discharge status: Home / Self Care | Payer: BLUE CROSS/BLUE SHIELD | Source: Ambulatory Visit | Attending: Orthopedic Surgery | Admitting: Orthopedic Surgery

## 2017-10-03 DIAGNOSIS — M879 Osteonecrosis, unspecified: Secondary | ICD-10-CM | POA: Insufficient documentation

## 2017-10-03 DIAGNOSIS — Z0183 Encounter for blood typing: Secondary | ICD-10-CM | POA: Insufficient documentation

## 2017-10-03 DIAGNOSIS — Z01812 Encounter for preprocedural laboratory examination: Secondary | ICD-10-CM | POA: Diagnosis not present

## 2017-10-03 HISTORY — DX: Essential (primary) hypertension: I10

## 2017-10-03 LAB — TYPE AND SCREEN
ABO/RH(D): O POS
ANTIBODY SCREEN: NEGATIVE

## 2017-10-03 LAB — SURGICAL PCR SCREEN
MRSA, PCR: NEGATIVE
STAPHYLOCOCCUS AUREUS: NEGATIVE

## 2017-10-03 LAB — CBC
HEMATOCRIT: 32.5 % — AB (ref 36.0–46.0)
HEMOGLOBIN: 10.4 g/dL — AB (ref 12.0–15.0)
MCH: 26.4 pg (ref 26.0–34.0)
MCHC: 32 g/dL (ref 30.0–36.0)
MCV: 82.5 fL (ref 78.0–100.0)
Platelets: 242 10*3/uL (ref 150–400)
RBC: 3.94 MIL/uL (ref 3.87–5.11)
RDW: 13.3 % (ref 11.5–15.5)
WBC: 6.5 10*3/uL (ref 4.0–10.5)

## 2017-10-03 LAB — BASIC METABOLIC PANEL
ANION GAP: 9 (ref 5–15)
BUN: 15 mg/dL (ref 6–20)
CHLORIDE: 100 mmol/L — AB (ref 101–111)
CO2: 25 mmol/L (ref 22–32)
Calcium: 9.3 mg/dL (ref 8.9–10.3)
Creatinine, Ser: 0.68 mg/dL (ref 0.44–1.00)
Glucose, Bld: 86 mg/dL (ref 65–99)
POTASSIUM: 3.5 mmol/L (ref 3.5–5.1)
SODIUM: 134 mmol/L — AB (ref 135–145)

## 2017-10-03 LAB — ABO/RH: ABO/RH(D): O POS

## 2017-10-03 NOTE — Pre-Procedure Instructions (Signed)
Cindy Stevens  10/03/2017      [id number] Cindy Stevens, [place] - [address] Cindy Stevens [place] [address] Phone: [phone] Fax: [phone]    Your procedure is scheduled on October 13, 2017.  Report to [facility] Admitting at 530 AM.  Call this number if you have problems the morning of surgery:  [phone]   Remember:  Do not eat food or drink liquids after midnight.  Take these medicines the morning of surgery with A SIP OF WATER tramadol (ultram)-if needed for pain.  7 days prior to surgery STOP taking any celecoxib (celebrex),  Aspirin, Aleve, Naproxen, Ibuprofen, Motrin, Advil, Goody's, BC's, all herbal medications, fish oil, and all vitamins  Continue all other medications as instructed by your physician except follow the above medication instructions before surgery   Do not wear jewelry, make-up or nail polish.  Do not wear lotions, powders, or perfumes, or deoderant.  Do not shave 48 hours prior to surgery.    Do not bring valuables to the hospital.  [facility] is not responsible for any belongings or valuables.  Contacts, dentures or bridgework may not be worn into surgery.  Leave your suitcase in the car.  After surgery it may be brought to your room.  For patients admitted to the hospital, discharge time will be determined by your treatment team.  Patients discharged the day of surgery will not be allowed to drive home.   Special instructions:   [facility]- Preparing For Surgery  Before surgery, you can play an important role. Because skin is not sterile, your skin needs to be as free of germs as possible. You can reduce the number of germs on your skin by washing with CHG (chlorahexidine gluconate) Soap before surgery.  CHG is an antiseptic cleaner which kills germs and bonds with the skin to continue killing germs even after washing.  Please do not use if you have an allergy to CHG or  antibacterial soaps. If your skin becomes reddened/irritated stop using the CHG.  Do not shave (including legs and underarms) for at least 48 hours prior to first CHG shower. It is OK to shave your face.  Please follow these instructions carefully.   1. Shower the NIGHT BEFORE SURGERY and the MORNING OF SURGERY with CHG.   2. If you chose to wash your hair, wash your hair first as usual with your normal shampoo.  3. After you shampoo, rinse your hair and body thoroughly to remove the shampoo.  4. Use CHG as you would any other liquid soap. You can apply CHG directly to the skin and wash gently with a scrungie or a clean washcloth.   5. Apply the CHG Soap to your body ONLY FROM THE NECK DOWN.  Do not use on open wounds or open sores. Avoid contact with your eyes, ears, mouth and genitals (private parts). Wash Face and genitals (private parts)  with your normal soap.  6. Wash thoroughly, paying special attention to the area where your surgery will be performed.  7. Thoroughly rinse your body with warm water from the neck down.  8. DO NOT shower/wash with your normal soap after using and rinsing off the CHG Soap.  9. Pat yourself dry with a CLEAN TOWEL.  10. Wear CLEAN PAJAMAS to bed the night before surgery, wear comfortable clothes the morning of surgery  11. Place CLEAN SHEETS on your bed the night of your first shower and  DO NOT SLEEP WITH PETS.    Day of Surgery: Do not apply any deodorants/lotions. Please wear clean clothes to the hospital/surgery center.     Please read over the following fact sheets that you were given. Pain Booklet, Coughing and Deep Breathing, MRSA Information and Surgical Site Infection Prevention

## 2017-10-03 NOTE — Progress Notes (Addendum)
UJW:JXBJYN, Godfrey Pick, FNP  Cardiologist: pt denies  EKG: 08/2017 at Dr. Glendale Chard office-requested today   Stress test: pt denies  ECHO: pt denies  Cardiac Cath: pt denies  Chest x-ray: 08/23/2017 in [facility]

## 2017-10-07 ENCOUNTER — Encounter (HOSPITAL_COMMUNITY): Payer: Self-pay

## 2017-10-10 MED ORDER — TRANEXAMIC ACID 1000 MG/10ML IV SOLN
1000.0000 mg | INTRAVENOUS | Status: AC
  Administered 2017-10-13: 1000 mg via INTRAVENOUS
  Filled 2017-10-10: qty 10

## 2017-10-12 NOTE — Anesthesia Preprocedure Evaluation (Signed)
Anesthesia Evaluation  Patient identified by MRN, date of birth, ID band Patient awake    Reviewed: Allergy & Precautions, NPO status , Patient's Chart, lab work & pertinent test results  Airway Mallampati: II  TM Distance: >3 FB Neck ROM: Full    Dental no notable dental hx.    Pulmonary neg pulmonary ROS,    Pulmonary exam normal breath sounds clear to auscultation       Cardiovascular hypertension, Pt. on medications negative cardio ROS Normal cardiovascular exam Rhythm:Regular Rate:Normal     Neuro/Psych negative neurological ROS  negative psych ROS   GI/Hepatic negative GI ROS, Neg liver ROS,   Endo/Other  negative endocrine ROS  Renal/GU negative Renal ROS  negative genitourinary   Musculoskeletal negative musculoskeletal ROS (+) Arthritis , Rheumatoid disorders,    Abdominal   Peds negative pediatric ROS (+)  Hematology negative hematology ROS (+)   Anesthesia Other Findings   Reproductive/Obstetrics negative OB ROS                             Anesthesia Physical Anesthesia Plan  ASA: II  Anesthesia Plan: Spinal   Post-op Pain Management:    Induction:   PONV Risk Score and Plan: 2 and Ondansetron, Dexamethasone, Treatment may vary due to age or medical condition and Midazolam  Airway Management Planned: Nasal Cannula and Natural Airway  Additional Equipment:   Intra-op Plan:   Post-operative Plan:   Informed Consent:   Plan Discussed with:   Anesthesia Plan Comments: (  )        Anesthesia Quick Evaluation

## 2017-10-13 ENCOUNTER — Encounter (HOSPITAL_COMMUNITY)
Admission: RE | Disposition: A | Discharge status: Home / Self Care | Payer: Self-pay | Source: Ambulatory Visit | Attending: Orthopedic Surgery

## 2017-10-13 ENCOUNTER — Inpatient Hospital Stay (HOSPITAL_COMMUNITY): Payer: BLUE CROSS/BLUE SHIELD

## 2017-10-13 ENCOUNTER — Encounter (HOSPITAL_COMMUNITY): Payer: Self-pay

## 2017-10-13 ENCOUNTER — Inpatient Hospital Stay (HOSPITAL_COMMUNITY): Payer: BLUE CROSS/BLUE SHIELD | Admitting: Emergency Medicine

## 2017-10-13 ENCOUNTER — Inpatient Hospital Stay (HOSPITAL_COMMUNITY)
Admission: RE | Admit: 2017-10-13 | Discharge: 2017-10-14 | DRG: 470 | Disposition: A | Discharge status: Home / Self Care | Payer: BLUE CROSS/BLUE SHIELD | Source: Ambulatory Visit | Attending: Orthopedic Surgery | Admitting: Orthopedic Surgery

## 2017-10-13 ENCOUNTER — Inpatient Hospital Stay (HOSPITAL_COMMUNITY): Payer: BLUE CROSS/BLUE SHIELD | Admitting: Certified Registered Nurse Anesthetist

## 2017-10-13 DIAGNOSIS — Z419 Encounter for procedure for purposes other than remedying health state, unspecified: Secondary | ICD-10-CM

## 2017-10-13 DIAGNOSIS — Z79899 Other long term (current) drug therapy: Secondary | ICD-10-CM | POA: Diagnosis not present

## 2017-10-13 DIAGNOSIS — Z09 Encounter for follow-up examination after completed treatment for conditions other than malignant neoplasm: Secondary | ICD-10-CM

## 2017-10-13 DIAGNOSIS — M879 Osteonecrosis, unspecified: Principal | ICD-10-CM | POA: Diagnosis present

## 2017-10-13 DIAGNOSIS — I10 Essential (primary) hypertension: Secondary | ICD-10-CM | POA: Diagnosis present

## 2017-10-13 DIAGNOSIS — M1612 Unilateral primary osteoarthritis, left hip: Secondary | ICD-10-CM | POA: Diagnosis present

## 2017-10-13 DIAGNOSIS — M069 Rheumatoid arthritis, unspecified: Secondary | ICD-10-CM | POA: Diagnosis present

## 2017-10-13 DIAGNOSIS — Z8249 Family history of ischemic heart disease and other diseases of the circulatory system: Secondary | ICD-10-CM

## 2017-10-13 DIAGNOSIS — M87052 Idiopathic aseptic necrosis of left femur: Secondary | ICD-10-CM | POA: Diagnosis present

## 2017-10-13 HISTORY — PX: TOTAL HIP ARTHROPLASTY: SHX124

## 2017-10-13 HISTORY — DX: Personal history of other diseases of the digestive system: Z87.19

## 2017-10-13 HISTORY — DX: Personal history of peptic ulcer disease: Z87.11

## 2017-10-13 HISTORY — DX: Unspecified osteoarthritis, unspecified site: M19.90

## 2017-10-13 SURGERY — ARTHROPLASTY, HIP, TOTAL, ANTERIOR APPROACH
Anesthesia: Spinal | Laterality: Left

## 2017-10-13 MED ORDER — EPHEDRINE SULFATE-NACL 50-0.9 MG/10ML-% IV SOSY
PREFILLED_SYRINGE | INTRAVENOUS | Status: DC | PRN
  Administered 2017-10-13: 5 mg via INTRAVENOUS
  Administered 2017-10-13: 10 mg via INTRAVENOUS

## 2017-10-13 MED ORDER — SODIUM CHLORIDE 0.9 % IJ SOLN
INTRAMUSCULAR | Status: DC | PRN
  Administered 2017-10-13: 30 mL via INTRAVENOUS

## 2017-10-13 MED ORDER — LACTATED RINGERS IV SOLN
INTRAVENOUS | Status: DC | PRN
  Administered 2017-10-13 (×2): via INTRAVENOUS

## 2017-10-13 MED ORDER — SORBITOL 70 % SOLN: 30.0000 mL | Freq: Every day | Status: DC | PRN

## 2017-10-13 MED ORDER — METOCLOPRAMIDE HCL 5 MG/ML IJ SOLN
5.0000 mg | Freq: Three times a day (TID) | INTRAMUSCULAR | Status: DC | PRN
  Administered 2017-10-13: 10 mg via INTRAVENOUS
  Filled 2017-10-13: qty 2

## 2017-10-13 MED ORDER — SODIUM CHLORIDE 0.9 % IV SOLN
INTRAVENOUS | Status: DC
  Administered 2017-10-13: 12:00:00 via INTRAVENOUS

## 2017-10-13 MED ORDER — PHENOL 1.4 % MT LIQD: 1.0000 | OROMUCOSAL | Status: DC | PRN

## 2017-10-13 MED ORDER — HYDROCODONE-ACETAMINOPHEN 5-325 MG PO TABS
1.0000 | ORAL_TABLET | ORAL | Status: DC | PRN
  Administered 2017-10-14: 1 via ORAL
  Filled 2017-10-13 (×2): qty 1

## 2017-10-13 MED ORDER — BUPIVACAINE-EPINEPHRINE (PF) 0.5% -1:200000 IJ SOLN
INTRAMUSCULAR | Status: DC | PRN
  Administered 2017-10-13: 30 mL via PERINEURAL

## 2017-10-13 MED ORDER — PROPOFOL 500 MG/50ML IV EMUL
INTRAVENOUS | Status: DC | PRN
  Administered 2017-10-13: 50 ug/kg/min via INTRAVENOUS

## 2017-10-13 MED ORDER — KETOROLAC TROMETHAMINE 15 MG/ML IJ SOLN
7.5000 mg | Freq: Four times a day (QID) | INTRAMUSCULAR | Status: AC
  Administered 2017-10-13 – 2017-10-14 (×3): 7.5 mg via INTRAVENOUS
  Filled 2017-10-13 (×3): qty 1

## 2017-10-13 MED ORDER — MEPERIDINE HCL 25 MG/ML IJ SOLN: 6.2500 mg | INTRAMUSCULAR | Status: DC | PRN

## 2017-10-13 MED ORDER — BUPIVACAINE IN DEXTROSE 0.75-8.25 % IT SOLN
INTRATHECAL | Status: DC | PRN
  Administered 2017-10-13: 12 mg via INTRATHECAL

## 2017-10-13 MED ORDER — HYDROMORPHONE HCL 1 MG/ML IJ SOLN
INTRAMUSCULAR | Status: AC
  Administered 2017-10-13: 1 mg via INTRAVENOUS
  Filled 2017-10-13: qty 1

## 2017-10-13 MED ORDER — PROPOFOL 1000 MG/100ML IV EMUL
INTRAVENOUS | Status: AC
  Filled 2017-10-13: qty 100

## 2017-10-13 MED ORDER — ASPIRIN 81 MG PO CHEW
81.0000 mg | CHEWABLE_TABLET | Freq: Two times a day (BID) | ORAL | Status: DC
  Administered 2017-10-13 – 2017-10-14 (×2): 81 mg via ORAL
  Filled 2017-10-13 (×2): qty 1

## 2017-10-13 MED ORDER — ACETAMINOPHEN 650 MG RE SUPP: 650.0000 mg | RECTAL | Status: DC | PRN

## 2017-10-13 MED ORDER — 0.9 % SODIUM CHLORIDE (POUR BTL) OPTIME
TOPICAL | Status: DC | PRN
  Administered 2017-10-13: 1000 mL

## 2017-10-13 MED ORDER — TRANEXAMIC ACID 1000 MG/10ML IV SOLN
1000.0000 mg | Freq: Once | INTRAVENOUS | Status: AC
  Administered 2017-10-13: 1000 mg via INTRAVENOUS
  Filled 2017-10-13: qty 10

## 2017-10-13 MED ORDER — ONDANSETRON HCL 4 MG/2ML IJ SOLN: 4.0000 mg | Freq: Four times a day (QID) | INTRAMUSCULAR | Status: DC | PRN

## 2017-10-13 MED ORDER — EPHEDRINE 5 MG/ML INJ
INTRAVENOUS | Status: AC
  Filled 2017-10-13: qty 10

## 2017-10-13 MED ORDER — KETOROLAC TROMETHAMINE 30 MG/ML IJ SOLN
INTRAMUSCULAR | Status: AC
  Filled 2017-10-13: qty 1

## 2017-10-13 MED ORDER — FENTANYL CITRATE (PF) 100 MCG/2ML IJ SOLN
INTRAMUSCULAR | Status: DC | PRN
  Administered 2017-10-13: 25 ug via INTRAVENOUS
  Administered 2017-10-13: 50 ug via INTRAVENOUS

## 2017-10-13 MED ORDER — ACETAMINOPHEN 10 MG/ML IV SOLN
1000.0000 mg | INTRAVENOUS | Status: AC
  Administered 2017-10-13: 1000 mg via INTRAVENOUS
  Filled 2017-10-13: qty 100

## 2017-10-13 MED ORDER — DIPHENHYDRAMINE HCL 12.5 MG/5ML PO ELIX: 12.5000 mg | ORAL_SOLUTION | ORAL | Status: DC | PRN

## 2017-10-13 MED ORDER — ONDANSETRON HCL 4 MG PO TABS
4.0000 mg | ORAL_TABLET | Freq: Four times a day (QID) | ORAL | Status: DC | PRN
  Administered 2017-10-13: 4 mg via ORAL
  Filled 2017-10-13: qty 1

## 2017-10-13 MED ORDER — METOCLOPRAMIDE HCL 5 MG PO TABS: 5.0000 mg | ORAL_TABLET | Freq: Three times a day (TID) | ORAL | Status: DC | PRN

## 2017-10-13 MED ORDER — CEFAZOLIN SODIUM-DEXTROSE 2-4 GM/100ML-% IV SOLN
2.0000 g | Freq: Four times a day (QID) | INTRAVENOUS | Status: AC
  Administered 2017-10-13 (×2): 2 g via INTRAVENOUS
  Filled 2017-10-13 (×2): qty 100

## 2017-10-13 MED ORDER — PROPOFOL 10 MG/ML IV BOLUS
INTRAVENOUS | Status: DC | PRN
  Administered 2017-10-13: 20 mg via INTRAVENOUS

## 2017-10-13 MED ORDER — HYDROCHLOROTHIAZIDE 25 MG PO TABS
12.5000 mg | ORAL_TABLET | Freq: Every day | ORAL | Status: DC
  Administered 2017-10-14: 12.5 mg via ORAL
  Filled 2017-10-13: qty 1

## 2017-10-13 MED ORDER — MIDAZOLAM HCL 5 MG/5ML IJ SOLN
INTRAMUSCULAR | Status: DC | PRN
  Administered 2017-10-13: 2 mg via INTRAVENOUS

## 2017-10-13 MED ORDER — PROPOFOL 10 MG/ML IV BOLUS
INTRAVENOUS | Status: AC
  Filled 2017-10-13: qty 20

## 2017-10-13 MED ORDER — FENTANYL CITRATE (PF) 100 MCG/2ML IJ SOLN
INTRAMUSCULAR | Status: AC
  Filled 2017-10-13: qty 2

## 2017-10-13 MED ORDER — HYDROCODONE-ACETAMINOPHEN 5-325 MG PO TABS
2.0000 | ORAL_TABLET | ORAL | Status: DC | PRN
  Administered 2017-10-13 – 2017-10-14 (×2): 2 via ORAL
  Filled 2017-10-13: qty 2

## 2017-10-13 MED ORDER — CHLORHEXIDINE GLUCONATE 4 % EX LIQD: 60.0000 mL | Freq: Once | CUTANEOUS | Status: DC

## 2017-10-13 MED ORDER — METHOCARBAMOL 500 MG PO TABS
500.0000 mg | ORAL_TABLET | Freq: Four times a day (QID) | ORAL | Status: DC | PRN
  Administered 2017-10-13: 500 mg via ORAL

## 2017-10-13 MED ORDER — KETOROLAC TROMETHAMINE 30 MG/ML IJ SOLN
INTRAMUSCULAR | Status: DC | PRN
  Administered 2017-10-13: 30 mg via INTRAVENOUS

## 2017-10-13 MED ORDER — METHOCARBAMOL 1000 MG/10ML IJ SOLN
500.0000 mg | Freq: Four times a day (QID) | INTRAVENOUS | Status: DC | PRN
  Filled 2017-10-13: qty 5

## 2017-10-13 MED ORDER — FENTANYL CITRATE (PF) 250 MCG/5ML IJ SOLN
INTRAMUSCULAR | Status: AC
  Filled 2017-10-13: qty 5

## 2017-10-13 MED ORDER — SODIUM CHLORIDE 0.9 % IV SOLN
INTRAVENOUS | Status: AC | PRN
  Administered 2017-10-13: 1000 mL via INTRAMUSCULAR

## 2017-10-13 MED ORDER — FENTANYL CITRATE (PF) 100 MCG/2ML IJ SOLN
25.0000 ug | INTRAMUSCULAR | Status: DC | PRN
  Administered 2017-10-13 (×2): 50 ug via INTRAVENOUS
  Administered 2017-10-13 (×2): 25 ug via INTRAVENOUS

## 2017-10-13 MED ORDER — ACETAMINOPHEN 325 MG PO TABS
650.0000 mg | ORAL_TABLET | ORAL | Status: DC | PRN
  Administered 2017-10-14: 650 mg via ORAL
  Filled 2017-10-13: qty 2

## 2017-10-13 MED ORDER — CEFAZOLIN SODIUM-DEXTROSE 2-4 GM/100ML-% IV SOLN
2.0000 g | INTRAVENOUS | Status: AC
  Administered 2017-10-13: 2 g via INTRAVENOUS
  Filled 2017-10-13: qty 100

## 2017-10-13 MED ORDER — SENNA 8.6 MG PO TABS
2.0000 | ORAL_TABLET | Freq: Every day | ORAL | Status: DC
  Administered 2017-10-13: 17.2 mg via ORAL
  Filled 2017-10-13: qty 2

## 2017-10-13 MED ORDER — DEXAMETHASONE SODIUM PHOSPHATE 10 MG/ML IJ SOLN
10.0000 mg | Freq: Once | INTRAMUSCULAR | Status: AC
  Administered 2017-10-14: 10 mg via INTRAVENOUS
  Filled 2017-10-13: qty 1

## 2017-10-13 MED ORDER — MENTHOL 3 MG MT LOZG: 1.0000 | LOZENGE | OROMUCOSAL | Status: DC | PRN

## 2017-10-13 MED ORDER — POVIDONE-IODINE 10 % EX SWAB: 2.0000 "application " | Freq: Once | CUTANEOUS | Status: DC

## 2017-10-13 MED ORDER — PHENYLEPHRINE HCL 10 MG/ML IJ SOLN
INTRAMUSCULAR | Status: DC | PRN
  Administered 2017-10-13: 25 ug/min via INTRAVENOUS

## 2017-10-13 MED ORDER — SODIUM CHLORIDE 0.9 % IV SOLN: INTRAVENOUS | Status: DC

## 2017-10-13 MED ORDER — ALUM & MAG HYDROXIDE-SIMETH 200-200-20 MG/5ML PO SUSP: 30.0000 mL | ORAL | Status: DC | PRN

## 2017-10-13 MED ORDER — DOCUSATE SODIUM 100 MG PO CAPS
100.0000 mg | ORAL_CAPSULE | Freq: Two times a day (BID) | ORAL | Status: DC
  Administered 2017-10-13 – 2017-10-14 (×2): 100 mg via ORAL
  Filled 2017-10-13 (×2): qty 1

## 2017-10-13 MED ORDER — SODIUM CHLORIDE 0.9 % IR SOLN
Status: DC | PRN
  Administered 2017-10-13: 3000 mL

## 2017-10-13 MED ORDER — BUPIVACAINE-EPINEPHRINE (PF) 0.5% -1:200000 IJ SOLN
INTRAMUSCULAR | Status: AC
  Filled 2017-10-13: qty 30

## 2017-10-13 MED ORDER — MIDAZOLAM HCL 2 MG/2ML IJ SOLN
INTRAMUSCULAR | Status: AC
  Filled 2017-10-13: qty 2

## 2017-10-13 MED ORDER — METHOCARBAMOL 500 MG PO TABS
ORAL_TABLET | ORAL | Status: AC
  Filled 2017-10-13: qty 1

## 2017-10-13 MED ORDER — HYDROMORPHONE HCL 1 MG/ML IJ SOLN
0.5000 mg | INTRAMUSCULAR | Status: DC | PRN
  Administered 2017-10-13: 1 mg via INTRAVENOUS

## 2017-10-13 MED ORDER — FENTANYL CITRATE (PF) 100 MCG/2ML IJ SOLN
INTRAMUSCULAR | Status: AC
  Administered 2017-10-13: 25 ug via INTRAVENOUS
  Filled 2017-10-13: qty 2

## 2017-10-13 MED ORDER — POLYETHYLENE GLYCOL 3350 17 G PO PACK: 17.0000 g | PACK | Freq: Every day | ORAL | Status: DC | PRN

## 2017-10-13 MED ORDER — HYDROCODONE-ACETAMINOPHEN 5-325 MG PO TABS
ORAL_TABLET | ORAL | Status: AC
  Administered 2017-10-13: 2 via ORAL
  Filled 2017-10-13: qty 2

## 2017-10-13 MED ORDER — ONDANSETRON HCL 4 MG/2ML IJ SOLN: 4.0000 mg | Freq: Once | INTRAMUSCULAR | Status: DC | PRN

## 2017-10-13 SURGICAL SUPPLY — 51 items
ALCOHOL ISOPROPYL (RUBBING) (MISCELLANEOUS) ×3 IMPLANT
BLADE CLIPPER SURG (BLADE) IMPLANT
CAPT HIP TOTAL 2 ×3 IMPLANT
CHLORAPREP W/TINT 26ML (MISCELLANEOUS) ×3 IMPLANT
COVER SURGICAL LIGHT HANDLE (MISCELLANEOUS) ×3 IMPLANT
DERMABOND ADVANCED (GAUZE/BANDAGES/DRESSINGS) ×4
DERMABOND ADVANCED .7 DNX12 (GAUZE/BANDAGES/DRESSINGS) ×2 IMPLANT
DRAPE C-ARM 42X72 X-RAY (DRAPES) ×3 IMPLANT
DRAPE STERI IOBAN 125X83 (DRAPES) ×3 IMPLANT
DRAPE U-SHAPE 47X51 STRL (DRAPES) ×9 IMPLANT
DRSG AQUACEL AG ADV 3.5X10 (GAUZE/BANDAGES/DRESSINGS) ×3 IMPLANT
ELECT BLADE 4.0 EZ CLEAN MEGAD (MISCELLANEOUS) ×3
ELECT REM PT RETURN 9FT ADLT (ELECTROSURGICAL) ×3
ELECTRODE BLDE 4.0 EZ CLN MEGD (MISCELLANEOUS) ×1 IMPLANT
ELECTRODE REM PT RTRN 9FT ADLT (ELECTROSURGICAL) ×1 IMPLANT
EVACUATOR 1/8 PVC DRAIN (DRAIN) IMPLANT
GLOVE BIO SURGEON STRL SZ8.5 (GLOVE) ×6 IMPLANT
GLOVE BIOGEL PI IND STRL 8.5 (GLOVE) ×1 IMPLANT
GLOVE BIOGEL PI INDICATOR 8.5 (GLOVE) ×2
GOWN STRL REUS W/ TWL LRG LVL3 (GOWN DISPOSABLE) ×2 IMPLANT
GOWN STRL REUS W/TWL 2XL LVL3 (GOWN DISPOSABLE) ×3 IMPLANT
GOWN STRL REUS W/TWL LRG LVL3 (GOWN DISPOSABLE) ×4
HANDPIECE INTERPULSE COAX TIP (DISPOSABLE) ×2
HOOD PEEL AWAY FACE SHEILD DIS (HOOD) ×6 IMPLANT
KIT BASIN OR (CUSTOM PROCEDURE TRAY) ×3 IMPLANT
KIT ROOM TURNOVER OR (KITS) ×3 IMPLANT
MANIFOLD NEPTUNE II (INSTRUMENTS) ×3 IMPLANT
MARKER SKIN DUAL TIP RULER LAB (MISCELLANEOUS) ×6 IMPLANT
NEEDLE SPNL 18GX3.5 QUINCKE PK (NEEDLE) ×3 IMPLANT
NS IRRIG 1000ML POUR BTL (IV SOLUTION) ×3 IMPLANT
PACK TOTAL JOINT (CUSTOM PROCEDURE TRAY) ×3 IMPLANT
PACK UNIVERSAL I (CUSTOM PROCEDURE TRAY) ×3 IMPLANT
PAD ARMBOARD 7.5X6 YLW CONV (MISCELLANEOUS) ×6 IMPLANT
SAW OSC TIP CART 19.5X105X1.3 (SAW) ×3 IMPLANT
SEALER BIPOLAR AQUA 6.0 (INSTRUMENTS) IMPLANT
SET HNDPC FAN SPRY TIP SCT (DISPOSABLE) ×1 IMPLANT
SOL PREP POV-IOD 4OZ 10% (MISCELLANEOUS) ×3 IMPLANT
SUT ETHIBOND NAB CT1 #1 30IN (SUTURE) ×6 IMPLANT
SUT MNCRL AB 3-0 PS2 18 (SUTURE) ×3 IMPLANT
SUT MON AB 2-0 CT1 36 (SUTURE) ×3 IMPLANT
SUT VIC AB 1 CT1 27 (SUTURE) ×2
SUT VIC AB 1 CT1 27XBRD ANBCTR (SUTURE) ×1 IMPLANT
SUT VIC AB 2-0 CT1 27 (SUTURE) ×2
SUT VIC AB 2-0 CT1 TAPERPNT 27 (SUTURE) ×1 IMPLANT
SUT VLOC 180 0 24IN GS25 (SUTURE) ×3 IMPLANT
SYR 50ML LL SCALE MARK (SYRINGE) ×3 IMPLANT
TOWEL OR 17X24 6PK STRL BLUE (TOWEL DISPOSABLE) ×3 IMPLANT
TOWEL OR 17X26 10 PK STRL BLUE (TOWEL DISPOSABLE) ×3 IMPLANT
TRAY CATH 16FR W/PLASTIC CATH (SET/KITS/TRAYS/PACK) IMPLANT
TRAY FOLEY CATH SILVER 16FR (SET/KITS/TRAYS/PACK) IMPLANT
WATER STERILE IRR 1000ML POUR (IV SOLUTION) ×9 IMPLANT

## 2017-10-13 NOTE — Progress Notes (Signed)
PT Cancellation Note  Patient Details Name: Cindy Stevens MRN: [id number] DOB: 08-16-1956   Cancelled Treatment:    Reason Eval/Treat Not Completed: Medical issues which prohibited therapy Pt with nausea and vomiting and requesting to hold PT. Will reattempt as schedule allows.   Gladys Damme, PT, DPT  Acute Rehabilitation Services  Pager: [phone]    Lehman Prom 10/13/2017, 4:31 PM

## 2017-10-13 NOTE — Anesthesia Postprocedure Evaluation (Signed)
Anesthesia Post Note  Patient: Cindy Stevens  Procedure(s) Performed: TOTAL HIP ARTHROPLASTY ANTERIOR APPROACH (Left )     Patient location during evaluation: PACU Anesthesia Type: Spinal Level of consciousness: oriented and awake and alert Pain management: pain level controlled Vital Signs Assessment: post-procedure vital signs reviewed and stable Respiratory status: spontaneous breathing, respiratory function stable and patient connected to nasal cannula oxygen Cardiovascular status: blood pressure returned to baseline and stable Postop Assessment: no headache, no backache and no apparent nausea or vomiting Anesthetic complications: no    Last Vitals:  Vitals:   10/13/17 1400 10/13/17 1410  BP: 107/61   Pulse: (!) 58 62  Resp: 15 18  Temp:  36.7 C  SpO2: 99% 99%    Last Pain:  Vitals:   10/13/17 1345  TempSrc:   PainSc: 7                  Sherif Millspaugh

## 2017-10-13 NOTE — Interval H&P Note (Signed)
History and Physical Interval Note:  10/13/2017 7:30 AM  Cindy Stevens  has presented today for surgery, with the diagnosis of left hip avascular necrosis  The various methods of treatment have been discussed with the patient and family. After consideration of risks, benefits and other options for treatment, the patient has consented to  Procedure(s): TOTAL HIP ARTHROPLASTY ANTERIOR APPROACH (Left) as a surgical intervention .  The patient's history has been reviewed, patient examined, no change in status, stable for surgery.  I have reviewed the patient's chart and labs.  Questions were answered to the patient's satisfaction.     Joeline Freer, Cloyde Reams

## 2017-10-13 NOTE — Discharge Instructions (Signed)
°Dr. Daphyne Miguez °Joint Replacement Specialist °[facility] °[address] °[place], [place] [address] °[phone] ° ° °TOTAL HIP REPLACEMENT POSTOPERATIVE DIRECTIONS ° ° ° °Hip Rehabilitation, Guidelines Following Surgery  ° °WEIGHT BEARING °Weight bearing as tolerated with assist device (walker, cane, etc) as directed, use it as long as suggested by your surgeon or therapist, typically at least 4-6 weeks. ° °The results of a hip operation are greatly improved after range of motion and muscle strengthening exercises. Follow all safety measures which are given to protect your hip. If any of these exercises cause increased pain or swelling in your joint, decrease the amount until you are comfortable again. Then slowly increase the exercises. Call your caregiver if you have problems or questions.  ° °HOME CARE INSTRUCTIONS  °Most of the following instructions are designed to prevent the dislocation of your new hip.  °Remove items at home which could result in a fall. This includes throw rugs or furniture in walking pathways.  °Continue medications as instructed at time of discharge. °· You may have some home medications which will be placed on hold until you complete the course of blood thinner medication. °· You may start showering once you are discharged home. Do not remove your dressing. °Do not put on socks or shoes without following the instructions of your caregivers.   °Sit on chairs with arms. Use the chair arms to help push yourself up when arising.  °Arrange for the use of a toilet seat elevator so you are not sitting low.  °· Walk with walker as instructed.  °You may resume a sexual relationship in one month or when given the OK by your caregiver.  °Use walker as long as suggested by your caregivers.  °You may put full weight on your legs and walk as much as is comfortable. °Avoid periods of inactivity such as sitting longer than an hour when not asleep. This helps prevent  blood clots.  °You may return to work once you are cleared by your surgeon.  °Do not drive a car for 6 weeks or until released by your surgeon.  °Do not drive while taking narcotics.  °Wear elastic stockings for two weeks following surgery during the day but you may remove then at night.  °Make sure you keep all of your appointments after your operation with all of your doctors and caregivers. You should call the office at the above phone number and make an appointment for approximately two weeks after the date of your surgery. °Please pick up a stool softener and laxative for home use as long as you are requiring pain medications. °· ICE to the affected hip every three hours for 30 minutes at a time and then as needed for pain and swelling. Continue to use ice on the hip for pain and swelling from surgery. You may notice swelling that will progress down to the foot and ankle.  This is normal after surgery.  Elevate the leg when you are not up walking on it.   °It is important for you to complete the blood thinner medication as prescribed by your doctor. °· Continue to use the breathing machine which will help keep your temperature down.  It is common for your temperature to cycle up and down following surgery, especially at night when you are not up moving around and exerting yourself.  The breathing machine keeps your lungs expanded and your temperature down. ° °RANGE OF MOTION AND STRENGTHENING EXERCISES  °These exercises are   designed to help you keep full movement of your hip joint. Follow your caregiver's or physical therapist's instructions. Perform all exercises about fifteen times, three times per day or as directed. Exercise both hips, even if you have had only one joint replacement. These exercises can be done on a training (exercise) mat, on the floor, on a table or on a bed. Use whatever works the best and is most comfortable for you. Use music or television while you are exercising so that the exercises  are a pleasant break in your day. This will make your life better with the exercises acting as a break in routine you can look forward to.  °Lying on your back, slowly slide your foot toward your buttocks, raising your knee up off the floor. Then slowly slide your foot back down until your leg is straight again.  °Lying on your back spread your legs as far apart as you can without causing discomfort.  °Lying on your side, raise your upper leg and foot straight up from the floor as far as is comfortable. Slowly lower the leg and repeat.  °Lying on your back, tighten up the muscle in the front of your thigh (quadriceps muscles). You can do this by keeping your leg straight and trying to raise your heel off the floor. This helps strengthen the largest muscle supporting your knee.  °Lying on your back, tighten up the muscles of your buttocks both with the legs straight and with the knee bent at a comfortable angle while keeping your heel on the floor.  ° °SKILLED REHAB INSTRUCTIONS: °If the patient is transferred to a skilled rehab facility following release from the hospital, a list of the current medications will be sent to the facility for the patient to continue.  When discharged from the skilled rehab facility, please have the facility set up the patient's Home Health Physical Therapy prior to being released. Also, the skilled facility will be responsible for providing the patient with their medications at time of release from the facility to include their pain medication and their blood thinner medication. If the patient is still at the rehab facility at time of the two week follow up appointment, the skilled rehab facility will also need to assist the patient in arranging follow up appointment in our office and any transportation needs. ° °MAKE SURE YOU:  °Understand these instructions.  °Will watch your condition.  °Will get help right away if you are not doing well or get worse. ° °Pick up stool softner and  laxative for home use following surgery while on pain medications. °Do not remove your dressing. °The dressing is waterproof--it is OK to take showers. °Continue to use ice for pain and swelling after surgery. °Do not use any lotions or creams on the incision until instructed by your surgeon. °Total Hip Protocol. ° ° °

## 2017-10-13 NOTE — Progress Notes (Signed)
Pt's  Upper dentures returned to her in PACU

## 2017-10-13 NOTE — H&P (View-Only) (Signed)
TOTAL HIP ADMISSION H&P  Patient is admitted for left total hip arthroplasty.  Subjective:  Chief Complaint: left hip pain  HPI: Cindy Stevens, 61 y.o. 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  Onset of symptoms was gradual starting 2 years ago with gradually worsening course since that time.The patient noted no past surgery on the left hip(s).  Patient currently rates pain in the left hip at 10 out of 10 with activity. Patient has night pain, worsening of pain with activity and weight bearing, trendelenberg gait, pain that interfers with activities of daily living, pain with passive range of motion and crepitus. Patient has evidence of joint space narrowing and AVN with collapse by imaging studies. This condition presents safety issues increasing the risk of falls. This patient has had avascular necrosis of the hip.  There is no current active infection.  There are no active problems to display for this patient.  Past Medical History:  Diagnosis Date  . Rheumatoid arthritis (HCC)     No past surgical history on file.   (Not in a hospital admission) No Known Allergies  Social History  Substance Use Topics  . Smoking status: Never Smoker  . Smokeless tobacco: Never Used  . Alcohol use No    Family History  Problem Relation Age of Onset  . Heart disease Sister   . Diabetes Sister      Review of Systems  Constitutional: Negative.   HENT: Negative.   Eyes: Negative.   Respiratory: Negative.   Cardiovascular: Negative.   Gastrointestinal: Negative.   Genitourinary: Negative.   Musculoskeletal: Positive for joint pain.  Skin: Negative.   Neurological: Negative.   Endo/Heme/Allergies:  Negative.   Psychiatric/Behavioral: Negative.     Objective:  Physical Exam  Vitals reviewed. Constitutional: She is oriented to person, place, and time. She appears well-developed and well-nourished.  HENT:  Head: Normocephalic and atraumatic.  Eyes: Pupils are equal, round, and reactive to light. Conjunctivae and EOM are normal.  Neck: Normal range of motion. Neck supple.  Cardiovascular: Normal rate, regular rhythm and intact distal pulses.   Respiratory: Effort normal. No respiratory distress.  GI: Soft. She exhibits no distension.  Genitourinary:  Genitourinary Comments: deferred  Musculoskeletal:       Left hip: She exhibits decreased range of motion, decreased strength, bony tenderness and crepitus.  Neurological: She is alert and oriented to person, place, and time. She has normal reflexes.  Skin: Skin is warm and dry.  Psychiatric: She has a normal mood and affect. Her behavior is normal. Judgment and thought content normal.    Vital signs in last 24 hours: @VSRANGES @  Labs:   Estimated body mass index is 28.02 kg/m as calculated from the following:   Height as of 11/22/16: 5\' 2"  (1.575 m).   Weight as of 11/22/16: 69.5 kg (153 lb 3.2 oz).   Imaging Review Plain radiographs demonstrate severe degenerative joint disease of the left hip(s). The bone quality appears to be adequate for age and reported activity level.  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 The  treatment options including medical management, injection therapy, arthroscopy and arthroplasty were discussed at length. The risks and benefits of total hip arthroplasty were presented and reviewed. The risks due to aseptic loosening, infection, stiffness, dislocation/subluxation,   thromboembolic complications and other imponderables were discussed.  The patient acknowledged the explanation, agreed to proceed with the plan and consent was signed. Patient is being admitted for inpatient treatment for surgery, pain control, PT, OT, prophylactic antibiotics, VTE prophylaxis, progressive ambulation and ADL's and discharge planning.The patient is planning to be discharged home with home health services 

## 2017-10-13 NOTE — Evaluation (Signed)
Physical Therapy Evaluation Patient Details Name: Cindy Stevens MRN: [id number] DOB: Sep 22, 1956 Today's Date: 10/13/2017   History of Present Illness  Pt is a 61 y/o female s/p elective L THA, direct anterior approach. PMH includes RA.   Clinical Impression  Pt s/p surgery above with deficits below. PTA, pt was using cane for ambulation. Pt initially refused session, however, RN requested assist with taking pt to bathroom, therefore, pt agreeable to going to the bathroom. Was still feeling nauseous, therefore mobility limited to bathroom and back. Required min guard assist for mobility this session. Will need to review THA exercise handout with pt during next session, as pt refusing this session. Pt reports husband will be able to assist as needed upon d/c. Will need DME below. Follow up recommendations per MD arrangements. Will continue to follow acutely to maximize functional mobility independence and safety.     Follow Up Recommendations DC plan and follow up therapy as arranged by surgeon;Supervision for mobility/OOB    Equipment Recommendations  Rolling walker with 5" wheels;3in1 (PT)    Recommendations for Other Services       Precautions / Restrictions Precautions Precautions: None Precaution Comments: Pt with nausea and refusing ther ex at this time. Will need to review THA in next session.  Restrictions Weight Bearing Restrictions: Yes LLE Weight Bearing: Weight bearing as tolerated      Mobility  Bed Mobility Overal bed mobility: Needs Assistance Bed Mobility: Supine to Sit;Sit to Supine     Supine to sit: Supervision Sit to supine: Supervision   General bed mobility comments: Supervision for safety   Transfers Overall transfer level: Needs assistance Equipment used: Rolling walker (2 wheeled) Transfers: Sit to/from Stand Sit to Stand: Min guard         General transfer comment: Min guard for safety. Cues for safe hand placement.    Ambulation/Gait Ambulation/Gait assistance: Min guard Ambulation Distance (Feet): 15 Feet Assistive device: Rolling walker (2 wheeled) Gait Pattern/deviations: Step-to pattern;Decreased step length - right;Decreased step length - left;Decreased weight shift to left;Antalgic Gait velocity: Decreased Gait velocity interpretation: Below normal speed for age/gender General Gait Details: Slow, antalgic gait during ambulation to the bathroom. Required verbal cues for sequencing and safety cues to keep RW with her as pt was wanting to leave it behind. Verbal cues for proximity to device as well.   Stairs            Wheelchair Mobility    Modified Rankin (Stroke Patients Only)       Balance Overall balance assessment: Needs assistance Sitting-balance support: No upper extremity supported;Feet supported Sitting balance-Leahy Scale: Good     Standing balance support: Bilateral upper extremity supported;During functional activity Standing balance-Leahy Scale: Poor Standing balance comment: Reliant on RW for stability                              Pertinent Vitals/Pain Pain Assessment: 0-10 Pain Score: 6  Pain Location: L hip  Pain Descriptors / Indicators: Aching;Operative site guarding Pain Intervention(s): Limited activity within patient's tolerance;Monitored during session;Repositioned    Home Living Family/patient expects to be discharged to:: Private residence Living Arrangements: Spouse/significant other Available Help at Discharge: Family;Available 24 hours/day Type of Home: House Home Access: Stairs to enter Entrance Stairs-Rails: Doctor, general practice of Steps: 3 Home Layout: One level Home Equipment: Cane - single point      Prior Function Level of Independence: Independent with assistive device(s)  Comments: Reports she was using cane for ambulation     Hand Dominance        Extremity/Trunk Assessment   Upper  Extremity Assessment Upper Extremity Assessment: Overall WFL for tasks assessed    Lower Extremity Assessment Lower Extremity Assessment: LLE deficits/detail LLE Deficits / Details: Sensory in tact. Deficits consistent with post op pain and weakness. Able to perform ther ex below.     Cervical / Trunk Assessment Cervical / Trunk Assessment: Normal  Communication   Communication: No difficulties  Cognition Arousal/Alertness: Awake/alert Behavior During Therapy: WFL for tasks assessed/performed Overall Cognitive Status: Within Functional Limits for tasks assessed                                        General Comments General comments (skin integrity, edema, etc.): Pt's husband present during session     Exercises     Assessment/Plan    PT Assessment Patient needs continued PT services  PT Problem List Decreased strength;Decreased activity tolerance;Decreased mobility;Decreased balance;Decreased knowledge of use of DME;Decreased knowledge of precautions;Pain       PT Treatment Interventions DME instruction;Gait training;Stair training;Functional mobility training;Therapeutic activities;Therapeutic exercise;Balance training;Neuromuscular re-education;Patient/family education    PT Goals (Current goals can be found in the Care Plan section)  Acute Rehab PT Goals Patient Stated Goal: to feel better  PT Goal Formulation: With patient Time For Goal Achievement: 10/20/17 Potential to Achieve Goals: Good    Frequency 7X/week   Barriers to discharge        Co-evaluation               AM-PAC PT "6 Clicks" Daily Activity  Outcome Measure Difficulty turning over in bed (including adjusting bedclothes, sheets and blankets)?: None Difficulty moving from lying on back to sitting on the side of the bed? : None Difficulty sitting down on and standing up from a chair with arms (e.g., wheelchair, bedside commode, etc,.)?: Unable Help needed moving to and from a  bed to chair (including a wheelchair)?: A Little Help needed walking in hospital room?: A Little Help needed climbing 3-5 steps with a railing? : A Lot 6 Click Score: 17    End of Session   Activity Tolerance: Treatment limited secondary to medical complications (Comment) (nausea ) Patient left: in bed;with call bell/phone within reach;with family/visitor present Nurse Communication: Mobility status PT Visit Diagnosis: Other abnormalities of gait and mobility (R26.89);Pain Pain - Right/Left: Left Pain - part of body: Hip    Time: 8366-2947 PT Time Calculation (min) (ACUTE ONLY): 11 min   Charges:   PT Evaluation $PT Eval Low Complexity: 1 Low     PT G Codes:        Gladys Damme, PT, DPT  Acute Rehabilitation Services  Pager: [phone]   Lehman Prom 10/13/2017, 4:55 PM

## 2017-10-13 NOTE — Anesthesia Procedure Notes (Signed)
Procedure Name: MAC Date/Time: 10/13/2017 7:45 AM Performed by: Everlean Cherry A Pre-anesthesia Checklist: Patient identified, Emergency Drugs available, Suction available and Patient being monitored Patient Re-evaluated:Patient Re-evaluated prior to induction Oxygen Delivery Method: Simple face mask

## 2017-10-13 NOTE — Op Note (Addendum)
OPERATIVE REPORT  SURGEON: Samson Frederic, MD   ASSISTANT: Hart Carwin, RNFA.  PREOPERATIVE DIAGNOSIS: Left hip avascular necrosis.   POSTOPERATIVE DIAGNOSIS: Left hip avascular necrosis.   PROCEDURE: Left total hip arthroplasty, anterior approach.   IMPLANTS: DePuy Tri Lock stem, size 3, hi offset. DePuy Pinnacle Cup, size 50 mm. DePuy Altrx liner, size 32 by 50 mm, neutral. DePuy Biolox ceramic head ball, size 32 + 5 mm. 6.5 mm cancellous bone screw x1.  ANESTHESIA:  Spinal  ESTIMATED BLOOD LOSS:-300 mL    ANTIBIOTICS: 2 g Ancef.  DRAINS: None.  COMPLICATIONS: None.   CONDITION: PACU - hemodynamically stable.   BRIEF CLINICAL NOTE: Cindy Stevens is a 61 y.o. female with a long-standing history of Right hip avascular necrosis. After failing conservative management, the patient was indicated for total hip arthroplasty. The risks, benefits, and alternatives to the procedure were explained, and the patient elected to proceed.  PROCEDURE IN DETAIL: Surgical site was marked by myself in the pre-op holding area. Once inside the operating room, spinal anesthesia was obtained, and a foley catheter was inserted. The patient was then positioned on the Hana table. All bony prominences were well padded. The hip was prepped and draped in the normal sterile surgical fashion. A time-out was called verifying side and site of surgery. The patient received IV antibiotics within 60 minutes of beginning the procedure.  The direct anterior approach to the hip was performed through the Hueter interval. Lateral femoral circumflex vessels were treated with the Auqumantys. The anterior capsule was exposed and an inverted T capsulotomy was made.The femoral neck cut was made to the level of the templated cut. A corkscrew was placed into the head and the head was removed. The femoral head was found to be collapsed with delaminated cartilage and eburnated bone. The head was passed to the  back table and was measured.  Acetabular exposure was achieved, and the pulvinar and labrum were excised. Sequential reaming of the acetabulum was then performed up to a size 49 mm reamer. A 50 mm cup was then opened and impacted into place at approximately 40 degrees of abduction and 20 degrees of anteversion. I chose to augment the already acceptable press fit fixation with a single 6.5 mm cancellous bone screw. The final polyethylene liner was impacted into place and acetabular osteophytes were removed.   I then gained femoral exposure taking care to protect the abductors and greater trochanter. This was performed using standard external rotation, extension, and adduction. The capsule was peeled off the inner aspect of the greater trochanter, taking care to preserve the short external rotators. A cookie cutter was used to enter the femoral canal, and then the femoral canal finder was placed. Sequential broaching was performed up to a size 3. Calcar planer was used on the femoral neck remnant. I placed a hi offset neck and a trial head ball. The hip was reduced. Leg lengths and offset were checked fluoroscopically. Lengthening of a couple mm was required to achieve adequate soft tissue tension. The hip was dislocated and trial components were removed. The final implants were placed, and the hip was reduced.  Fluoroscopy was used to confirm component position and leg lengths. At 90 degrees of external rotation and full extension, the hip was stable to an anterior directed force.  The wound was copiously irrigated with normal saline using pulse lavage. Marcaine solution was injected into the periarticular soft tissue. The wound was closed in layers using #1 Vicryl and V-Loc for  the fascia, 2-0 Vicryl for the subcutaneous fat, 2-0 Monocryl for the deep dermal layer, 3-0 running Monocryl subcuticular stitch, and Dermabond for the skin. Once the glue was fully dried, an Aquacell Ag dressing was  applied. The patient was transported to the recovery room in stable condition. Sponge, needle, and instrument counts were correct at the end of the case x2. The patient tolerated the procedure well and there were no known complications.

## 2017-10-13 NOTE — Anesthesia Procedure Notes (Signed)
Spinal  Patient location during procedure: OR Start time: 10/13/2017 7:45 AM End time: 10/13/2017 7:47 AM Staffing Anesthesiologist: Yeni Jiggetts Preanesthetic Checklist Completed: patient identified, site marked, surgical consent, pre-op evaluation, timeout performed, IV checked, risks and benefits discussed and monitors and equipment checked Spinal Block Patient position: sitting Prep: DuraPrep Patient monitoring: heart rate, cardiac monitor, continuous pulse ox and blood pressure Approach: midline Location: L4-5 Injection technique: single-shot Needle Needle type: Sprotte  Needle gauge: 24 G Needle length: 9 cm Assessment Sensory level: T4

## 2017-10-13 NOTE — Transfer of Care (Signed)
Immediate Anesthesia Transfer of Care Note  Patient: NABIHA PLANCK  Procedure(s) Performed: TOTAL HIP ARTHROPLASTY ANTERIOR APPROACH (Left )  Patient Location: PACU  Anesthesia Type:Spinal  Level of Consciousness: awake, alert , drowsy and patient cooperative  Airway & Oxygen Therapy: Patient Spontanous Breathing and Patient connected to face mask oxygen  Post-op Assessment: Report given to RN and Post -op Vital signs reviewed and stable  Post vital signs: Reviewed and stable  Last Vitals:  Vitals:   10/13/17 0550  BP: (!) 143/75  Pulse: 71  Resp: 20  Temp: 36.6 C  SpO2: 100%    Last Pain:  Vitals:   10/13/17 0623  TempSrc:   PainSc: 4       Patients Stated Pain Goal: 2 (10/13/17 6659)  Complications: No apparent anesthesia complications

## 2017-10-14 ENCOUNTER — Encounter (HOSPITAL_COMMUNITY): Payer: Self-pay | Admitting: Orthopedic Surgery

## 2017-10-14 LAB — CBC
HEMATOCRIT: 29.3 % — AB (ref 36.0–46.0)
Hemoglobin: 9.4 g/dL — ABNORMAL LOW (ref 12.0–15.0)
MCH: 26.2 pg (ref 26.0–34.0)
MCHC: 32.1 g/dL (ref 30.0–36.0)
MCV: 81.6 fL (ref 78.0–100.0)
PLATELETS: 207 10*3/uL (ref 150–400)
RBC: 3.59 MIL/uL — AB (ref 3.87–5.11)
RDW: 13 % (ref 11.5–15.5)
WBC: 8.8 10*3/uL (ref 4.0–10.5)

## 2017-10-14 LAB — BASIC METABOLIC PANEL
ANION GAP: 8 (ref 5–15)
BUN: 5 mg/dL — ABNORMAL LOW (ref 6–20)
CO2: 26 mmol/L (ref 22–32)
Calcium: 8.3 mg/dL — ABNORMAL LOW (ref 8.9–10.3)
Chloride: 97 mmol/L — ABNORMAL LOW (ref 101–111)
Creatinine, Ser: 0.59 mg/dL (ref 0.44–1.00)
GFR calc Af Amer: >60 mL/min (ref >60)
GLUCOSE: 116 mg/dL — AB (ref 65–99)
POTASSIUM: 3.7 mmol/L (ref 3.5–5.1)
Sodium: 131 mmol/L — ABNORMAL LOW (ref 135–145)

## 2017-10-14 MED ORDER — SENNA 8.6 MG PO TABS: 2.0000 | ORAL_TABLET | Freq: Every day | ORAL | 0 refills | Status: DC

## 2017-10-14 MED ORDER — DOCUSATE SODIUM 100 MG PO CAPS: 100.0000 mg | ORAL_CAPSULE | Freq: Two times a day (BID) | ORAL | 1 refills | Status: DC

## 2017-10-14 MED ORDER — ONDANSETRON HCL 4 MG PO TABS: 4.0000 mg | ORAL_TABLET | Freq: Four times a day (QID) | ORAL | 0 refills | Status: DC | PRN

## 2017-10-14 MED ORDER — HYDROCODONE-ACETAMINOPHEN 5-325 MG PO TABS: 1.0000 | ORAL_TABLET | Freq: Four times a day (QID) | ORAL | 0 refills | Status: DC | PRN

## 2017-10-14 MED ORDER — ASPIRIN 81 MG PO CHEW: 81.0000 mg | CHEWABLE_TABLET | Freq: Two times a day (BID) | ORAL | 1 refills | Status: DC

## 2017-10-14 NOTE — Evaluation (Signed)
Occupational Therapy Evaluation Patient Details Name: Cindy Stevens MRN: [id number] DOB: 1956-07-09 Today's Date: 10/14/2017    History of Present Illness Pt is a 61 y/o female s/p elective L THA, direct anterior approach. PMH includes RA.    Clinical Impression   PTA, pt was living with husband and was independent. Currently, pt requires supervision for ADLs and functional mobility using RW. Provided education on LB ADLs, toilet transfer, and tub transfer with shower chair; pt demonstrated understanding. Answered all pt questions. Recommend dc home once medically stable per physician. All acute OT needs met and will sign off. Thank you.     Follow Up Recommendations  DC plan and follow up therapy as arranged by surgeon;Supervision/Assistance - 24 hour    Equipment Recommendations  None recommended by OT (Discussed with pt and husband shower chair)    Recommendations for Other Services PT consult     Precautions / Restrictions Precautions Precautions: None Restrictions Weight Bearing Restrictions: Yes LLE Weight Bearing: Weight bearing as tolerated      Mobility Bed Mobility Overal bed mobility: Modified Independent Bed Mobility: Supine to Sit;Sit to Supine     Supine to sit: Modified independent (Device/Increase time) Sit to supine: Modified independent (Device/Increase time)   General bed mobility comments: Bed set up to simulate home  Transfers Overall transfer level: Needs assistance Equipment used: Rolling walker (2 wheeled) Transfers: Sit to/from Stand Sit to Stand: Supervision         General transfer comment: Supervision for safety    Balance Overall balance assessment: Needs assistance Sitting-balance support: No upper extremity supported;Feet supported Sitting balance-Leahy Scale: Good     Standing balance support: Bilateral upper extremity supported Standing balance-Leahy Scale: Fair Standing balance comment: Reliant on RW for stability                             ADL either performed or assessed with clinical judgement   ADL Overall ADL's : Needs assistance/impaired Eating/Feeding: Set up;Sitting   Grooming: Set up;Standing;Supervision/safety   Upper Body Bathing: Set up;Supervision/ safety;Sitting   Lower Body Bathing: Set up;Supervison/ safety;Sit to/from stand   Upper Body Dressing : Set up;Sitting Upper Body Dressing Details (indicate cue type and reason): donned bra and shirt Lower Body Dressing: Set up;Supervision/safety;Sit to/from stand Lower Body Dressing Details (indicate cue type and reason): Provided education on LB dressing and compensatory techniques. Pt donning underwear and pants with supervision.   Toilet Transfer Details (indicate cue type and reason): Discussed using 3N1 over toilet to raise and provide handles.      Tub/ Shower Transfer: Tub transfer;Supervision/safety;Cueing for sequencing;Ambulation;Shower Technical sales engineer Details (indicate cue type and reason): Provided education on proper equipment to use at home and safe technique. Pt and husband verbalize understanding. Pt performed   General ADL Comments: Supervision for ADLs and functional mobility. Pt moving well and feel she will progress well with time. Provided education on LB dressing and tub transfer.     Vision         Perception     Praxis      Pertinent Vitals/Pain Pain Assessment: No/denies pain Pain Location: L hip  Pain Descriptors / Indicators: Aching;Operative site guarding Pain Intervention(s): Monitored during session     Hand Dominance Right   Extremity/Trunk Assessment Upper Extremity Assessment Upper Extremity Assessment: Overall WFL for tasks assessed   Lower Extremity Assessment Lower Extremity Assessment: Defer to PT evaluation LLE Deficits / Details:  Sensory in tact. Deficits consistent with post op pain and weakness. Able to perform ther ex below.    Cervical /  Trunk Assessment Cervical / Trunk Assessment: Normal   Communication Communication Communication: No difficulties   Cognition Arousal/Alertness: Awake/alert Behavior During Therapy: WFL for tasks assessed/performed Overall Cognitive Status: Within Functional Limits for tasks assessed                                     General Comments  Husband present throughout session    Exercises    Shoulder Instructions      Home Living Family/patient expects to be discharged to:: Private residence Living Arrangements: Spouse/significant other Available Help at Discharge: Family;Available 24 hours/day Type of Home: House Home Access: Stairs to enter [employer] of Steps: 3 Entrance Stairs-Rails: Right;Left Home Layout: One level     Bathroom Shower/Tub: Tub/shower unit;Door   [employer] Toilet: Standard     Home Equipment: Cane - single point          Prior Functioning/Environment Level of Independence: Independent with assistive device(s)        Comments: Reports she was using cane for ambulation        OT Problem List: Decreased range of motion;Impaired balance (sitting and/or standing);Decreased knowledge of use of DME or AE;Decreased knowledge of precautions;Pain      OT Treatment/Interventions:      OT Goals(Current goals can be found in the care plan section) Acute Rehab OT Goals Patient Stated Goal: to get home OT Goal Formulation: With patient Time For Goal Achievement: 10/28/17 Potential to Achieve Goals: Good  OT Frequency:     Barriers to D/C:            Co-evaluation              AM-PAC PT "6 Clicks" Daily Activity     Outcome Measure Help from another person eating meals?: None Help from another person taking care of personal grooming?: None Help from another person toileting, which includes using toliet, bedpan, or urinal?: A Little Help from another person bathing (including washing, rinsing, drying)?: A  Little Help from another person to put on and taking off regular upper body clothing?: None Help from another person to put on and taking off regular lower body clothing?: A Little 6 Click Score: 21   End of Session Equipment Utilized During Treatment: Rolling walker Nurse Communication: Mobility status  Activity Tolerance: Patient tolerated treatment well Patient left: in bed;with call bell/phone within reach  OT Visit Diagnosis: Unsteadiness on feet (R26.81);Pain Pain - Right/Left: Left Pain - part of body: Hip                Time: 2482-5003 OT Time Calculation (min): 25 min Charges:  OT General Charges $OT Visit: 1 Visit OT Evaluation $OT Eval Low Complexity: 1 Low OT Treatments $Self Care/Home Management : 8-22 mins G-Codes:     [place], OTR/L Acute Rehab Pager: [phone] Office: [place] 10/14/2017, 4:33 PM

## 2017-10-14 NOTE — Progress Notes (Signed)
   Subjective:  Patient reports pain as mild to moderate.  Had N/V - resolved. Denies Cp/SOB.  Objective:   VITALS:   Vitals:   10/13/17 1544 10/13/17 2048 10/14/17 0700 10/14/17 1100  BP: 132/67 117/65 119/66   Pulse: (!) 59 66 92   Resp: 16 17 17    Temp: (!) 97.5 F (36.4 C) 97.8 F (36.6 C) (!) 100.5 F (38.1 C)   TempSrc: Axillary Oral Oral   SpO2: 100% 100% 100%   Height:    5\' 3"  (1.6 m)    NAD ABD soft Sensation intact distally Intact pulses distally Dorsiflexion/Plantar flexion intact Incision: dressing C/D/I Compartment soft   Lab Results  Component Value Date   WBC 8.8 10/14/2017   HGB 9.4 (L) 10/14/2017   HCT 29.3 (L) 10/14/2017   MCV 81.6 10/14/2017   PLT 207 10/14/2017   BMET    Component Value Date/Time   NA 131 (L) 10/14/2017 0650   K 3.7 10/14/2017 0650   CL 97 (L) 10/14/2017 0650   CO2 26 10/14/2017 0650   GLUCOSE 116 (H) 10/14/2017 0650   BUN 5 (L) 10/14/2017 0650   CREATININE 0.59 10/14/2017 0650   CREATININE 0.67 03/09/2015 1747   CALCIUM 8.3 (L) 10/14/2017 0650   GFRNONAA >60 10/14/2017 0650   GFRAA >60 10/14/2017 0650     Assessment/Plan: 1 Day Post-Op   Principal Problem:   Osteoarthritis of left hip Active Problems:   Avascular necrosis of hip, left (HCC)   WBAT with walker DVT ppx: ASA, SCDs, TEDs PO pain control PT/OT Dispo: D/C home with HEP   Rainier Feuerborn, 10/16/2017 10/14/2017, 1:14 PM   Cloyde Reams, MD Cell [phone]

## 2017-10-14 NOTE — Progress Notes (Signed)
Discharge instructions and prescriptions/medications reviewed with patient and patient's husband. Verbalized understanding. Post-op instructions also reviewed. IV removed, catheter tip intact. No questions/concerns at this time. Patient awaiting transport to lobby for discharge.     10/14/17 1657  Vitals  Temp 98.7 F (37.1 C)  Temp Source Oral  BP 95/61  BP Location Right Arm  BP Method Automatic  Patient Position (if appropriate) Sitting  Pulse Rate 78  Pulse Rate Source Dinamap  Resp 16  Oxygen Therapy  SpO2 100 %  O2 Device Room Air

## 2017-10-14 NOTE — Progress Notes (Signed)
Physical Therapy Treatment Patient Details Name: Cindy Stevens MRN: [id number] DOB: 16-Apr-1956 Today's Date: 10/14/2017    History of Present Illness Pt is a 61 y/o female s/p elective L THA, direct anterior approach. PMH includes RA.     PT Comments    Pt is excited to go home. She required cues to stand without placing both hands on walker. Practiced safe sit to stand transfer with proper hand placement. Pt did better with wheeling walker. Was easily distracted and almost ran into several things. Required cues to focus on walker placement. Pt was more aware of stance time and was able to self correct more. Practiced steps. Pt required verbal cues for correct sequencing. Reviewed HEP and had pt perform all supine exercises. Explained rationale and reasons for several exercises upon pt request. Reviewed and demonstrated standing HEP exercises to pt and husband. Pt expressed concern about getting into the tub once home. Requested OT to instruct pt on tub transfer.   Follow Up Recommendations  DC plan and follow up therapy as arranged by surgeon;Supervision for mobility/OOB     Equipment Recommendations  Rolling walker with 5" wheels;3in1 (PT)    Recommendations for Other Services       Precautions / Restrictions Precautions Precautions: None Restrictions Weight Bearing Restrictions: Yes LLE Weight Bearing: Weight bearing as tolerated    Mobility  Bed Mobility Overal bed mobility: Modified Independent Bed Mobility: Supine to Sit;Sit to Supine     Supine to sit: Modified independent (Device/Increase time) Sit to supine: Modified independent (Device/Increase time)   General bed mobility comments: Pt required verbal cues to slow down with sit to stand.  Transfers Overall transfer level: Needs assistance Equipment used: Rolling walker (2 wheeled) Transfers: Sit to/from Stand Sit to Stand: Min guard         General transfer comment: Min guard due to impulsiveness.  Required cues for safe hand placement during sit to stand with walker.  Ambulation/Gait Ambulation/Gait assistance: Min guard Ambulation Distance (Feet): 730 Feet Assistive device: Rolling walker (2 wheeled) Gait Pattern/deviations: Step-through pattern;Decreased step length - right;Decreased step length - left;Decreased stride length;Decreased stance time - left Gait velocity: Decreased   General Gait Details: Pt was able to ambulate a greater distance. Required min cues to increase stance time on L and for equal step length. Pt was able to self correct resulting in shorter step length. Required cues to focus on where she was going and avoid running into walls with walker.    Stairs Stairs: Yes   Stair Management: Two rails;Step to pattern;Forwards Number of Stairs: 4 General stair comments: Instructed pt and family on proper sequencing for stairs. Pt. Was able to climb/descend stairs with modified independence.  Wheelchair Mobility    Modified Rankin (Stroke Patients Only)       Balance Overall balance assessment: Needs assistance Sitting-balance support: No upper extremity supported;Feet supported       Standing balance support: Bilateral upper extremity supported Standing balance-Leahy Scale: Fair Standing balance comment: Reliant on RW for stability                             Cognition Arousal/Alertness: Awake/alert Behavior During Therapy: WFL for tasks assessed/performed Overall Cognitive Status: Within Functional Limits for tasks assessed  Exercises Total Joint Exercises Ankle Circles/Pumps: AROM;Both;20 reps;Supine Quad Sets: AROM;Left;Supine;15 reps Towel Squeeze: AROM;Both;15 reps;Supine Short Arc Quad: AROM;Left;Supine;10 reps Heel Slides: AROM;Supine;15 reps;Left Hip ABduction/ADduction: AROM;Left;15 reps;Supine Long Arc Quad: AROM;Left;Seated;15 reps    General Comments         Pertinent Vitals/Pain Pain Assessment: No/denies pain Pain Intervention(s): Monitored during session    Home Living                      Prior Function            PT Goals (current goals can now be found in the care plan section) Acute Rehab PT Goals Patient Stated Goal: to get home PT Goal Formulation: With patient Time For Goal Achievement: 10/20/17 Potential to Achieve Goals: Good Progress towards PT goals: Progressing toward goals    Frequency    7X/week      PT Plan Current plan remains appropriate    Co-evaluation              AM-PAC PT "6 Clicks" Daily Activity  Outcome Measure  Difficulty turning over in bed (including adjusting bedclothes, sheets and blankets)?: None Difficulty moving from lying on back to sitting on the side of the bed? : None Difficulty sitting down on and standing up from a chair with arms (e.g., wheelchair, bedside commode, etc,.)?: A Little Help needed moving to and from a bed to chair (including a wheelchair)?: A Little Help needed walking in hospital room?: A Little Help needed climbing 3-5 steps with a railing? : A Little 6 Click Score: 20    End of Session Equipment Utilized During Treatment: Gait belt Activity Tolerance: Patient tolerated treatment well;No increased pain Patient left: in bed;with call bell/phone within reach;with family/visitor present Nurse Communication: Mobility status PT Visit Diagnosis: Other abnormalities of gait and mobility (R26.89) Pain - Right/Left: Left Pain - part of body: Hip     Time: 5537-4827 PT Time Calculation (min) (ACUTE ONLY): 35 min  Charges:  $Gait Training: 8-22 mins $Therapeutic Exercise: 8-22 mins                    G Codes:       Sharlene Motts, SPTA   Sharlene Motts 10/14/2017, 3:24 PM

## 2017-10-14 NOTE — Care Management Note (Signed)
Case Management Note  Patient Details  Name: Cindy Stevens MRN: [id number] Date of Birth: [birth year]  Subjective/Objective:   61 yr old female s/p left total hip arthroplasty, anterior approach.                 Action/Plan: Patient will not have HH services. Will follow the Home Exercise Program as outlined by Dr. Kathline Magic office. DME has been ordered, patient will have family support at discharge.   Expected Discharge Date:  10/14/17               Expected Discharge Plan:   (HEP)  In-House Referral:  NA  Discharge planning Services  CM Consult  Post Acute Care Choice:  Durable Medical Equipment Choice offered to:  NA  DME Arranged:  3-N-1, Walker rolling DME Agency:  [facility]  HH Arranged:  NA HH Agency:  NA  Status of Service:  Completed, signed off  If discussed at Long Length of Stay Meetings, dates discussed:    Additional Comments:  Durenda Guthrie, RN 10/14/2017, 3:22 PM

## 2017-10-14 NOTE — Discharge Summary (Signed)
Physician Discharge Summary  Patient ID: Cindy Stevens MRN: [id number] DOB/AGE: [birth year] 61 y.o.  Admit date: 10/13/2017 Discharge date: 10/14/2017  Admission Diagnoses:  Osteoarthritis of left hip  Discharge Diagnoses:  Principal Problem:   Osteoarthritis of left hip Active Problems:   Avascular necrosis of hip, left [facility])   Past Medical History:  Diagnosis Date  . Arthritis    "left knee; hands, shoulders" (10/13/2017)  . History of gastric ulcer   . Hypertension   . Rheumatoid arthritis ([place])     Surgeries: Procedure(s): TOTAL HIP ARTHROPLASTY ANTERIOR APPROACH on 10/13/2017   Consultants (if any):   Discharged Condition: Improved  Hospital Course: Cindy Stevens is an 61 y.o. female who was admitted 10/13/2017 with a diagnosis of Osteoarthritis of left hip and went to the operating room on 10/13/2017 and underwent the above named procedures.    She was given perioperative antibiotics:  Anti-infectives    Start     Dose/Rate Route Frequency Ordered Stop   10/13/17 1500  ceFAZolin (ANCEF) IVPB 2g/100 mL premix     2 g 200 mL/hr over 30 Minutes Intravenous Every 6 hours 10/13/17 1001 10/13/17 2255   10/13/17 0611  ceFAZolin (ANCEF) IVPB 2g/100 mL premix     2 g 200 mL/hr over 30 Minutes Intravenous On call to O.R. 10/13/17 5009 10/13/17 0740    .  She was given sequential compression devices, early ambulation, and ASA for DVT prophylaxis.  She benefited maximally from the hospital stay and there were no complications.    Recent vital signs:  Vitals:   10/13/17 2048 10/14/17 0700  BP: 117/65 119/66  Pulse: 66 92  Resp: 17 17  Temp: 97.8 F (36.6 C) (!) 100.5 F (38.1 C)  SpO2: 100% 100%    Recent laboratory studies:  Lab Results  Component Value Date   HGB 9.4 (L) 10/14/2017   HGB 10.4 (L) 10/03/2017   Lab Results  Component Value Date   WBC 8.8 10/14/2017   PLT 207 10/14/2017   No results found for: INR Lab Results  Component Value  Date   NA 131 (L) 10/14/2017   K 3.7 10/14/2017   CL 97 (L) 10/14/2017   CO2 26 10/14/2017   BUN 5 (L) 10/14/2017   CREATININE 0.59 10/14/2017   GLUCOSE 116 (H) 10/14/2017    Discharge Medications:   Allergies as of 10/14/2017   No Known Allergies     Medication List    STOP taking these medications   CIMZIA PREFILLED 2 X 200 MG/ML Kit Generic drug:  Certolizumab Pegol   traMADol 50 MG tablet Commonly known as:  ULTRAM     TAKE these medications   aspirin 81 MG chewable tablet Chew 1 tablet (81 mg total) by mouth 2 (two) times daily.   celecoxib 200 MG capsule Commonly known as:  CELEBREX Take 200 mg by mouth 2 (two) times daily as needed.   docusate sodium 100 MG capsule Commonly known as:  COLACE Take 1 capsule (100 mg total) by mouth 2 (two) times daily.   hydrochlorothiazide 12.5 MG tablet Commonly known as:  HYDRODIURIL Take 12.5 mg by mouth daily.   HYDROcodone-acetaminophen 5-325 MG tablet Commonly known as:  NORCO/VICODIN Take 1-2 tablets by mouth every 6 (six) hours as needed for moderate pain ((score 4 to 6)).   multivitamin with minerals tablet Take 1 tablet by mouth daily.   ondansetron 4 MG tablet Commonly known as:  ZOFRAN Take 1 tablet (4 mg total)  by mouth every 6 (six) hours as needed for nausea.   senna 8.6 MG Tabs tablet Commonly known as:  SENOKOT Take 2 tablets (17.2 mg total) by mouth at bedtime.       Diagnostic Studies: Dg Pelvis Portable  Result Date: 10/13/2017 CLINICAL DATA:  Status post total hip replacement on the left EXAM: PORTABLE PELVIS 1-2 VIEWS COMPARISON:  None. FINDINGS: Frontal view pelvis obtained. There is a total hip replacement on left with prosthetic components well-seated. No acute fracture or dislocation. There is slight narrowing of the right hip joint. IMPRESSION: Total hip replacement on the left with prosthetic components well-seated. No acute fracture or dislocation. Slight narrowing right hip joint.  Electronically Signed   By: Lowella Grip III M.D.   On: 10/13/2017 12:27   Dg C-arm 61-120 Min  Result Date: 10/13/2017 CLINICAL DATA:  Status post left total hip replacement EXAM: DG C-ARM 61-120 MIN; OPERATIVE LEFT HIP:  1 V COMPARISON:  None. FINDINGS: Frontal view obtained. There is a total hip replacement on the left with prosthetic components well-seated. No acute fracture or dislocation evident on this single view. There is mild narrowing of the right hip joint. IMPRESSION: No hip replacement on the left with prosthetic components well-seated on frontal view. No evident fracture or dislocation. Electronically Signed   By: Lowella Grip III M.D.   On: 10/13/2017 09:28   Dg Hip Operative Unilat W Or W/o Pelvis Left  Result Date: 10/13/2017 CLINICAL DATA:  Status post left total hip replacement EXAM: DG C-ARM 61-120 MIN; OPERATIVE LEFT HIP:  1 V COMPARISON:  None. FINDINGS: Frontal view obtained. There is a total hip replacement on the left with prosthetic components well-seated. No acute fracture or dislocation evident on this single view. There is mild narrowing of the right hip joint. IMPRESSION: No hip replacement on the left with prosthetic components well-seated on frontal view. No evident fracture or dislocation. Electronically Signed   By: Lowella Grip III M.D.   On: 10/13/2017 09:28    Disposition: Final discharge disposition not confirmed  Discharge Instructions    Call MD / Call 911    Complete by:  As directed    If you experience chest pain or shortness of breath, CALL 911 and be transported to the hospital emergency room.  If you develope a fever above 101 F, pus (white drainage) or increased drainage or redness at the wound, or calf pain, call your surgeon's office.   Constipation Prevention    Complete by:  As directed    Drink plenty of fluids.  Prune juice may be helpful.  You may use a stool softener, such as Colace (over the counter) 100 mg twice a day.  Use  MiraLax (over the counter) for constipation as needed.   Diet - low sodium heart healthy    Complete by:  As directed    Driving restrictions    Complete by:  As directed    No driving for 6 weeks   Increase activity slowly as tolerated    Complete by:  As directed    Lifting restrictions    Complete by:  As directed    No lifting for 6 weeks   TED hose    Complete by:  As directed    Use stockings (TED hose) for 2 weeks on both leg(s).  You may remove them at night for sleeping.      Follow-up Information    Kynadi Dragos, Aaron Edelman, MD. Schedule an appointment as  soon as possible for a visit in 2 weeks.   Specialty:  Orthopedic Surgery Why:  For wound re-check Contact information: [address]. [address] [place] [address] [phone]            Signed: Elie Goody 10/14/2017, 1:17 PM

## 2017-10-14 NOTE — Progress Notes (Signed)
Physical Therapy Treatment Patient Details Name: Cindy Stevens MRN: [id number] DOB: [birth year] Today's Date: 10/14/2017    History of Present Illness Pt is a 61 y/o female s/p elective L THA, direct anterior approach. PMH includes RA.     PT Comments    Pt. Was eager to participate in therapy as she was feeling much better than yesterday. Eagerness increased pt's impulsiveness; required cues to slow down. She was able to ambulate further than yesterday. Required cues to increase stance time on L. Pt. Was able to correct with focused attention. Reviewed supine exercises from HEP. Patient was able to perform with cues for speed and proper form. Pt. Was given opportunity to ask questions about HEP. Did not have any at this time. Pt. Requested to remain seated in chair.   Follow Up Recommendations  DC plan and follow up therapy as arranged by surgeon;Supervision for mobility/OOB     Equipment Recommendations  Rolling walker with 5" wheels;3in1 (PT)    Recommendations for Other Services       Precautions / Restrictions Precautions Precautions: None Precaution Comments: Pt. presented with no nausea and was able to participate in PT. Restrictions Weight Bearing Restrictions: Yes LLE Weight Bearing: Weight bearing as tolerated    Mobility  Bed Mobility Overal bed mobility: Modified Independent Bed Mobility: Supine to Sit     Supine to sit: Modified independent (Device/Increase time)     General bed mobility comments: Pt impulsive with sit to stand.   Transfers Overall transfer level: Needs assistance Equipment used: Rolling walker (2 wheeled) Transfers: Sit to/from Stand Sit to Stand: Min guard         General transfer comment: Min guard due to impulsiveness.  Ambulation/Gait Ambulation/Gait assistance: Min guard Ambulation Distance (Feet): 365 Feet Assistive device: Rolling walker (2 wheeled) Gait Pattern/deviations: Step-through pattern;Decreased stance time -  left;Decreased step length - right Gait velocity: Decreased Gait velocity interpretation: Below normal speed for age/gender General Gait Details: Pt. was able to ambulation a greater distance. Required cues to increase stance time on L and for equal step length. Pt. was able to use step through gait pattern and stayed within correct proximity to device.    Stairs            Wheelchair Mobility    Modified Rankin (Stroke Patients Only)       Balance Overall balance assessment: Needs assistance Sitting-balance support: No upper extremity supported;Feet supported Sitting balance-Leahy Scale: Good     Standing balance support: Bilateral upper extremity supported Standing balance-Leahy Scale: Fair Standing balance comment: Reliant on RW for stability                             Cognition Arousal/Alertness: Awake/alert Behavior During Therapy: WFL for tasks assessed/performed;Impulsive Overall Cognitive Status: Within Functional Limits for tasks assessed                                        Exercises Total Joint Exercises Ankle Circles/Pumps: AROM;Both;20 reps Quad Sets: AROM;Left;10 reps;Supine Short Arc Quad: AROM;Left;10 reps;Supine Heel Slides: AROM;Seated Hip ABduction/ADduction: AROM;Left;15 reps Long Arc Quad: AROM;Seated;Left;15 reps    General Comments        Pertinent Vitals/Pain Pain Assessment: No/denies pain (Had just taken pain meds) Pain Score: 0-No pain Pain Intervention(s): Monitored during session;Ice applied    Home Living  Prior Function            PT Goals (current goals can now be found in the care plan section) Acute Rehab PT Goals Patient Stated Goal: to get home PT Goal Formulation: With patient Time For Goal Achievement: 10/20/17 Potential to Achieve Goals: Good Progress towards PT goals: Progressing toward goals    Frequency    7X/week      PT Plan Current plan  remains appropriate    Co-evaluation              AM-PAC PT "6 Clicks" Daily Activity  Outcome Measure  Difficulty turning over in bed (including adjusting bedclothes, sheets and blankets)?: None Difficulty moving from lying on back to sitting on the side of the bed? : None Difficulty sitting down on and standing up from a chair with arms (e.g., wheelchair, bedside commode, etc,.)?: A Little Help needed moving to and from a bed to chair (including a wheelchair)?: A Little Help needed walking in hospital room?: A Little Help needed climbing 3-5 steps with a railing? : A Lot 6 Click Score: 19    End of Session Equipment Utilized During Treatment: Gait belt Activity Tolerance: Patient tolerated treatment well;No increased pain Patient left: in chair;with family/visitor present;with call bell/phone within reach Nurse Communication: Mobility status PT Visit Diagnosis: Other abnormalities of gait and mobility (R26.89);Pain Pain - Right/Left: Left Pain - part of body: Hip     Time: 3546-5681 PT Time Calculation (min) (ACUTE ONLY): 28 min  Charges:  $Gait Training: 8-22 mins $Therapeutic Exercise: 8-22 mins                    G Codes:       Sharlene Motts, SPTA   Sharlene Motts 10/14/2017, 10:52 AM

## 2018-06-26 IMAGING — DX DG PORTABLE PELVIS
1 series · 1 of 1 positions shown · non-contrast
Comparison: None.

CLINICAL DATA: Status post total hip replacement on the left

EXAM:
PORTABLE PELVIS 1-2 VIEWS

[pelvis ap]
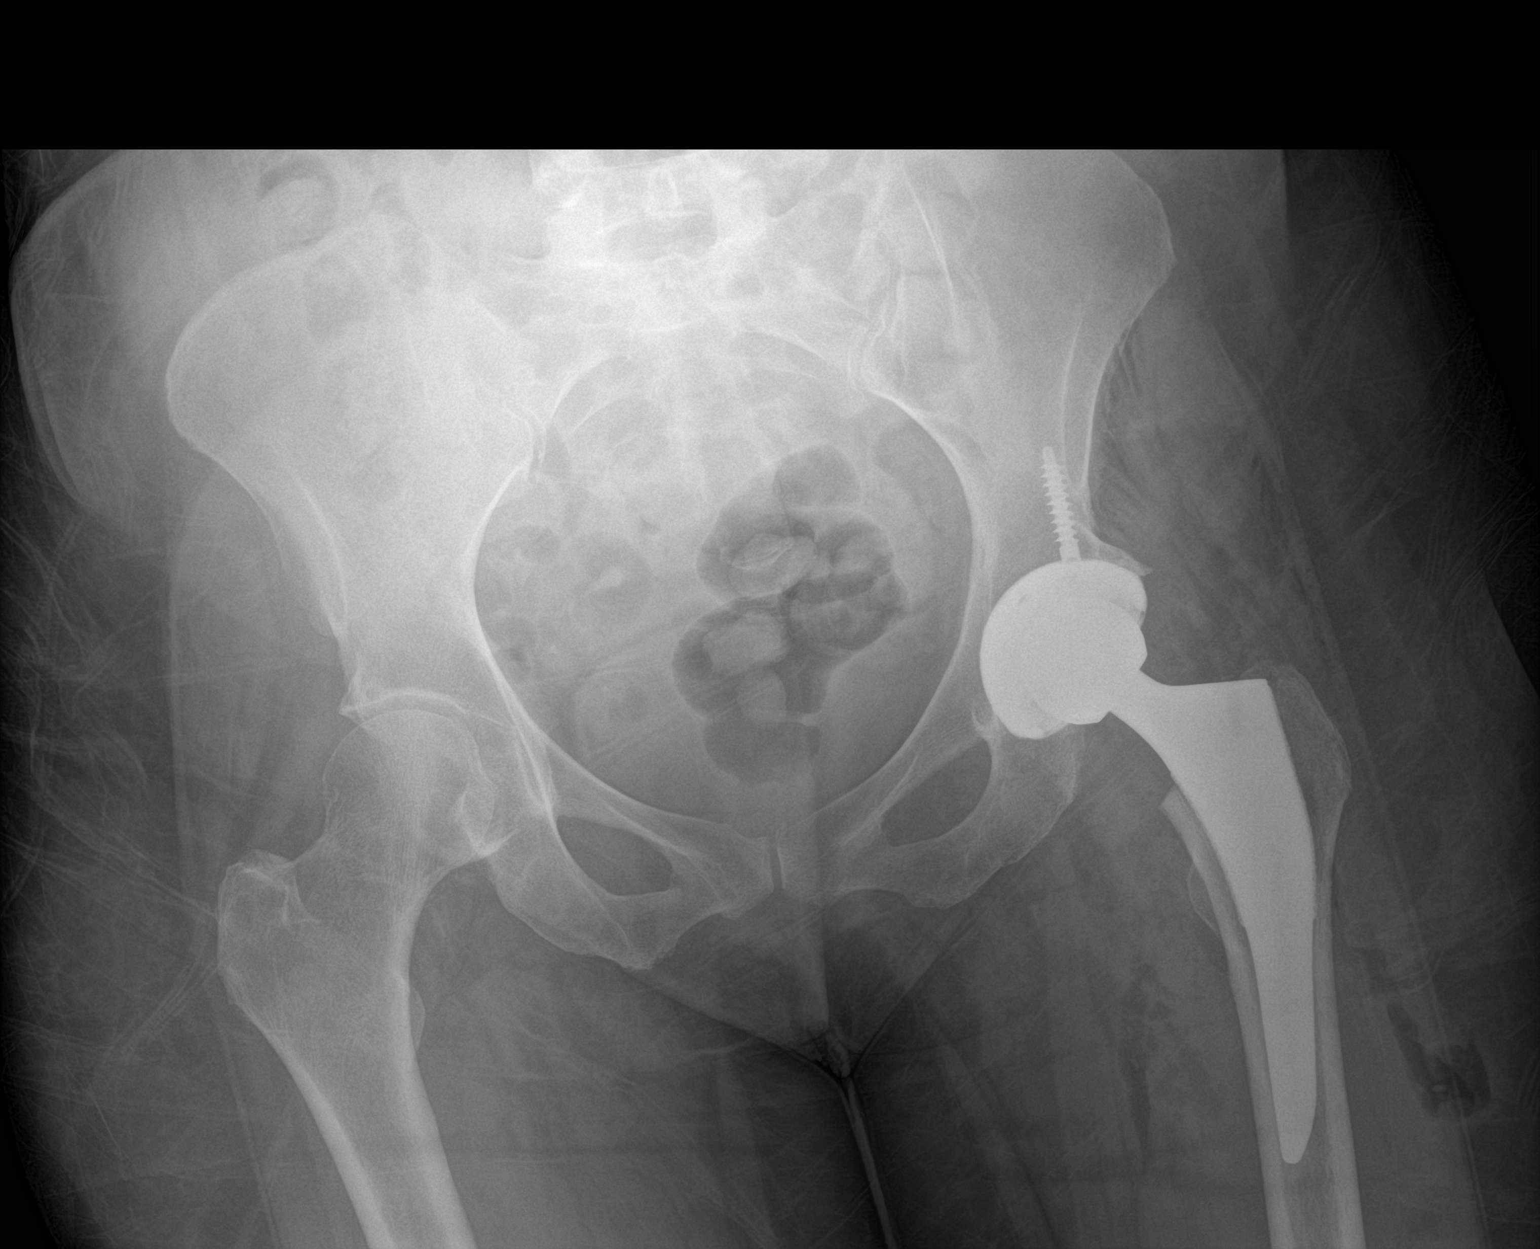

[1 of 1 positions shown; findings below may reference images not displayed]

FINDINGS: Frontal view pelvis obtained. There is a total hip replacement on
left with prosthetic components well-seated. No acute fracture or
dislocation. There is slight narrowing of the right hip joint.
IMPRESSION: Total hip replacement on the left with prosthetic components
well-seated. No acute fracture or dislocation. Slight narrowing
right hip joint.

## 2019-09-24 ENCOUNTER — Other Ambulatory Visit: Payer: Self-pay | Admitting: Family Medicine

## 2019-09-24 DIAGNOSIS — Z1231 Encounter for screening mammogram for malignant neoplasm of breast: Secondary | ICD-10-CM

## 2019-09-28 ENCOUNTER — Ambulatory Visit: Payer: BLUE CROSS/BLUE SHIELD

## 2019-10-27 ENCOUNTER — Other Ambulatory Visit: Payer: Self-pay

## 2019-10-27 ENCOUNTER — Ambulatory Visit
Admission: RE | Admit: 2019-10-27 | Discharge: 2019-10-27 | Disposition: A | Discharge status: Home / Self Care | Payer: BC Managed Care – PPO | Source: Ambulatory Visit | Attending: Family Medicine | Admitting: Family Medicine

## 2019-10-27 DIAGNOSIS — Z1231 Encounter for screening mammogram for malignant neoplasm of breast: Secondary | ICD-10-CM

## 2019-11-01 ENCOUNTER — Other Ambulatory Visit: Payer: Self-pay | Admitting: Family Medicine

## 2019-11-01 DIAGNOSIS — R928 Other abnormal and inconclusive findings on diagnostic imaging of breast: Secondary | ICD-10-CM

## 2019-11-17 ENCOUNTER — Other Ambulatory Visit: Payer: Self-pay | Admitting: Family Medicine

## 2019-11-17 ENCOUNTER — Other Ambulatory Visit: Payer: Self-pay

## 2019-11-17 ENCOUNTER — Ambulatory Visit
Admission: RE | Admit: 2019-11-17 | Discharge: 2019-11-17 | Disposition: A | Discharge status: Home / Self Care | Payer: BC Managed Care – PPO | Source: Ambulatory Visit | Attending: Family Medicine | Admitting: Family Medicine

## 2019-11-17 DIAGNOSIS — R928 Other abnormal and inconclusive findings on diagnostic imaging of breast: Secondary | ICD-10-CM

## 2019-11-17 DIAGNOSIS — N6489 Other specified disorders of breast: Secondary | ICD-10-CM

## 2020-02-10 ENCOUNTER — Ambulatory Visit: Payer: BC Managed Care – PPO

## 2020-02-14 ENCOUNTER — Ambulatory Visit: Payer: BC Managed Care – PPO | Attending: Family

## 2020-02-14 DIAGNOSIS — Z23 Encounter for immunization: Secondary | ICD-10-CM | POA: Insufficient documentation

## 2020-02-14 NOTE — Progress Notes (Signed)
   Covid-19 Vaccination Clinic  Name:  Ashawna Hanback    MRN: [id number] DOB: [birth year]  02/14/2020  Ms. Chaires was observed post Covid-19 immunization for 15 minutes without incidence. She was provided with Vaccine Information Sheet and instruction to access the V-Safe system.   Ms. Langlais was instructed to call 911 with any severe reactions post vaccine: Marland Kitchen Difficulty breathing  . Swelling of your face and throat  . A fast heartbeat  . A bad rash all over your body  . Dizziness and weakness    Immunizations Administered    Name Date Dose VIS Date Route   Moderna COVID-19 Vaccine 02/14/2020  9:37 AM 0.5 mL 11/23/2019 Intramuscular   Manufacturer: Moderna   Lot: [id number]   NDC: 88891-694-50

## 2020-03-14 ENCOUNTER — Ambulatory Visit: Payer: Self-pay | Attending: Family

## 2020-03-14 DIAGNOSIS — Z23 Encounter for immunization: Secondary | ICD-10-CM

## 2020-03-14 NOTE — Progress Notes (Signed)
   Covid-19 Vaccination Clinic  Name:  Cindy Stevens    MRN: [id number] DOB: 09-22-1956  03/14/2020  Ms. [name] was observed post Covid-19 immunization for 15 minutes without incident. She was provided with Vaccine Information Sheet and instruction to access the V-Safe system.   Ms. [name] was instructed to call 911 with any severe reactions post vaccine: Marland Kitchen Difficulty breathing  . Swelling of face and throat  . A fast heartbeat  . A bad rash all over body  . Dizziness and weakness   Immunizations Administered    Name Date Dose VIS Date Route   Moderna COVID-19 Vaccine 03/14/2020  9:24 AM 0.5 mL 11/23/2019 Intramuscular   Manufacturer: Moderna   Lot: [id number]   NDC: 18550-158-68

## 2020-07-30 IMAGING — MG MM DIGITAL DIAGNOSTIC UNILAT*L* W/ TOMO W/ CAD
4 series · 4 of 12 positions shown · non-contrast
Comparison: Previous exam(s).

CLINICAL DATA: Screening recall from baseline for a left breast
asymmetry.

EXAM:
DIGITAL DIAGNOSTIC LEFT MAMMOGRAM WITH CAD AND TOMO
ULTRASOUND LEFT BREAST

[L CC synth-2D]
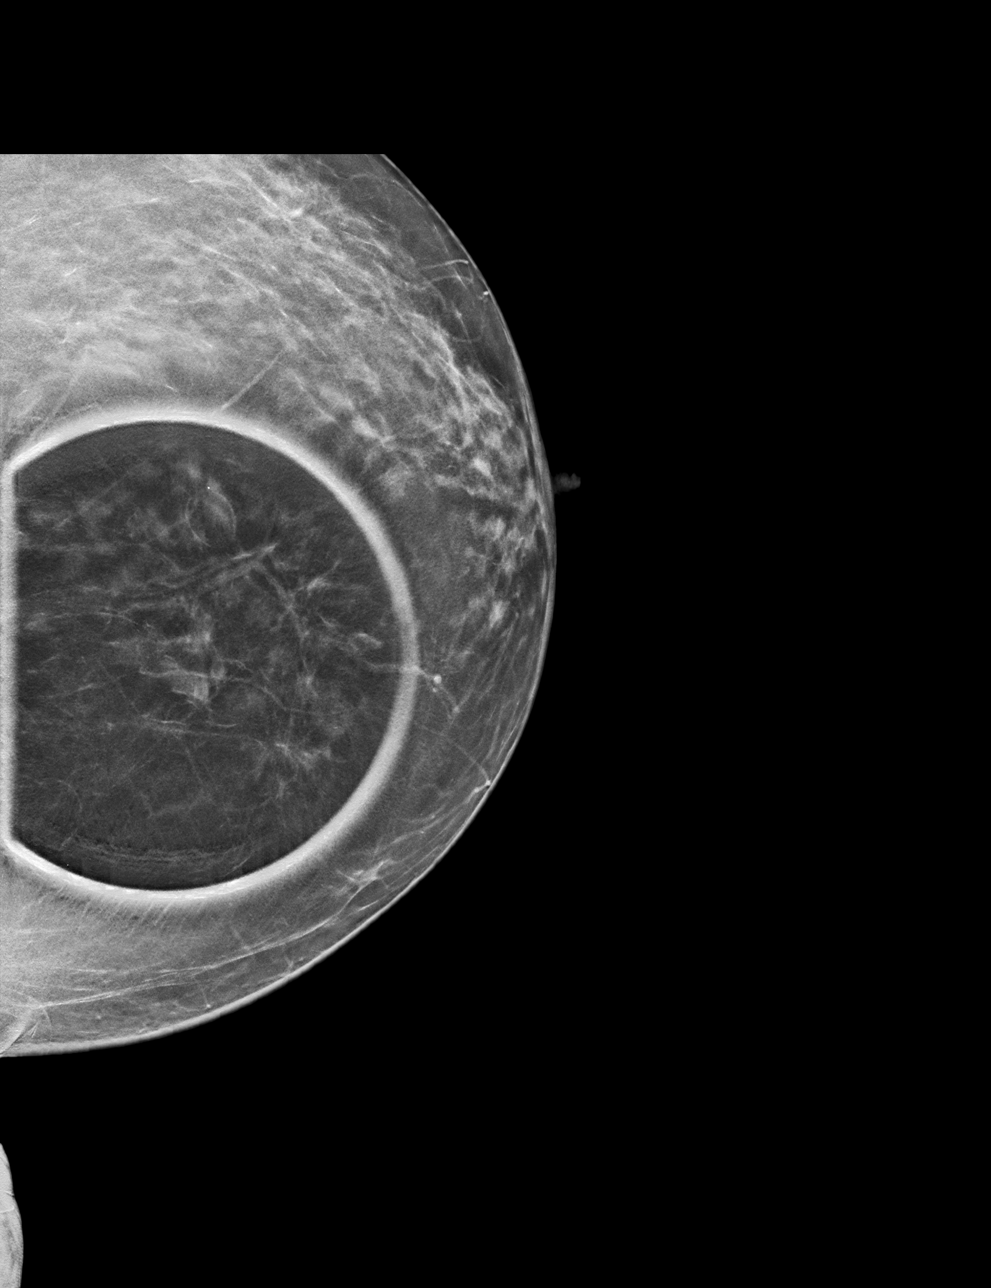

[L ML synth-2D]
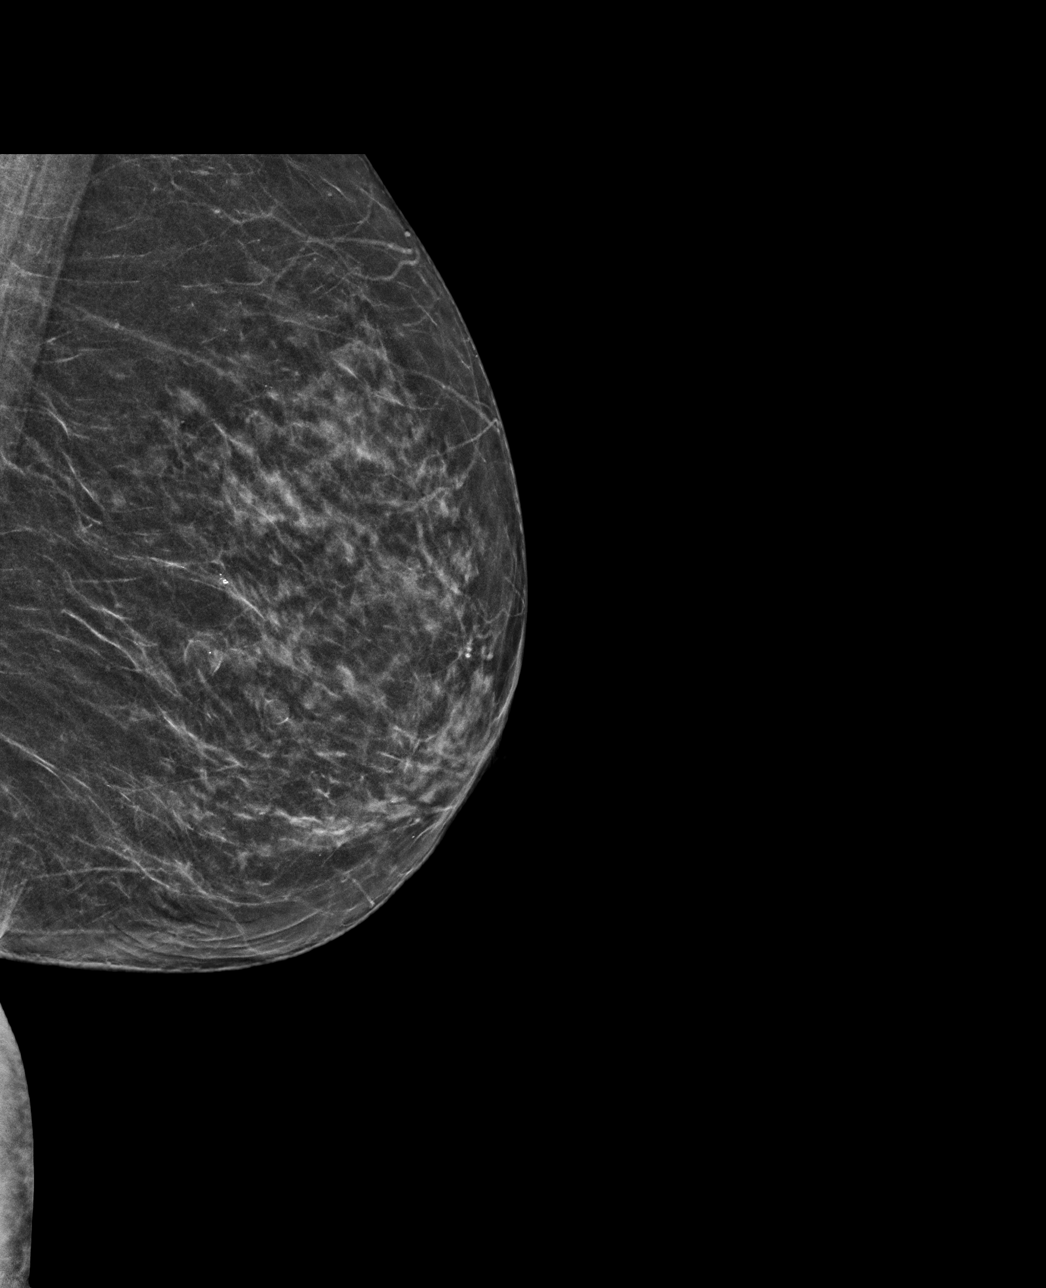

[L ML tomo · tomo slice 29/57.0]
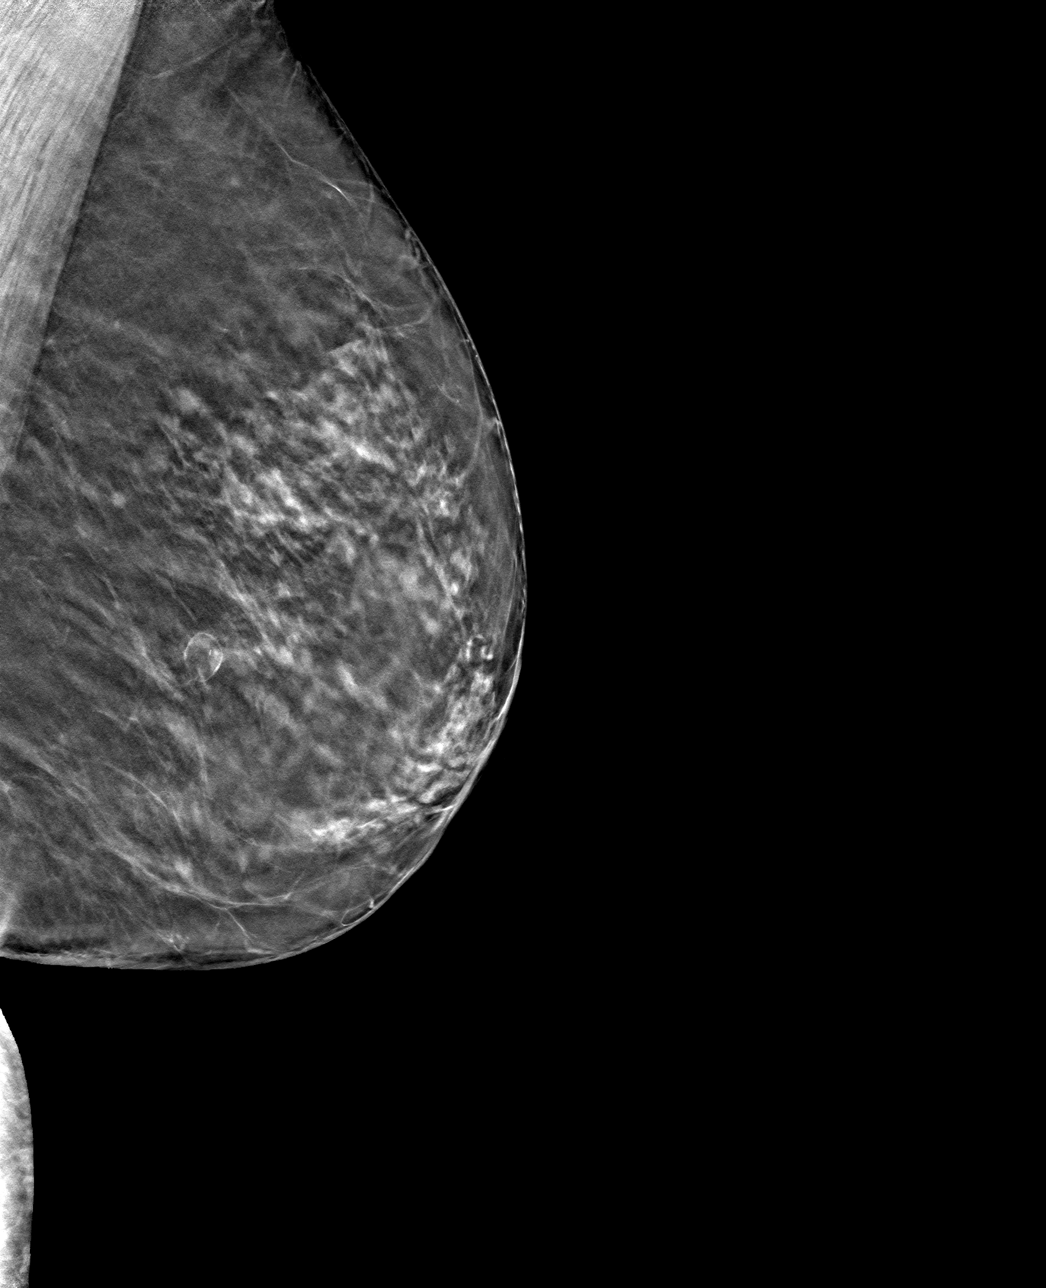

[L CC tomo · tomo slice 26/51.0]
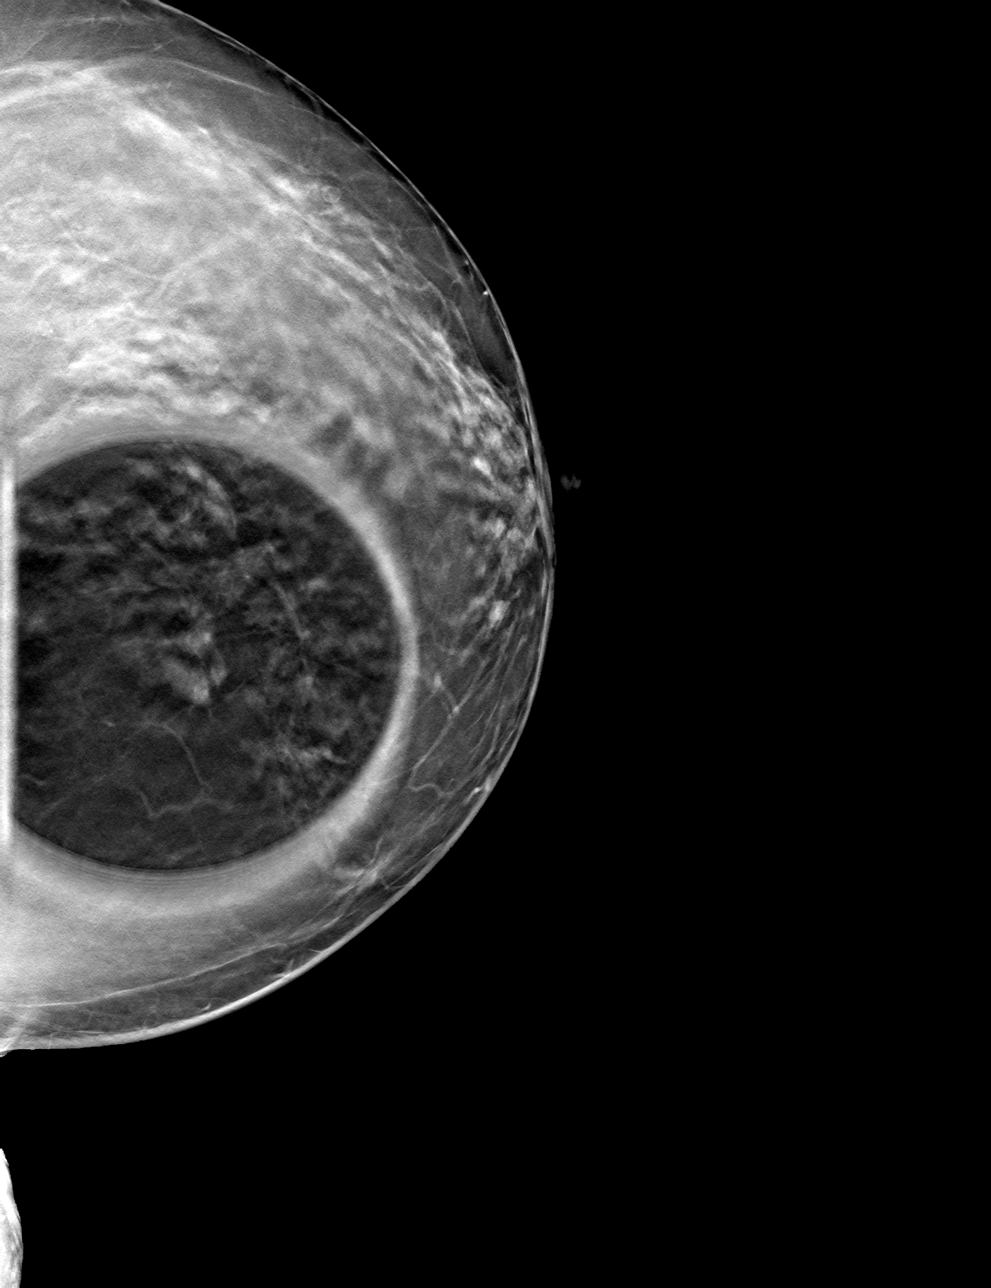

[4 of 12 positions shown; findings below may reference images not displayed]

ACR Breast Density Category b: There are scattered areas of
fibroglandular density.
FINDINGS: Spot compression tomosynthesis images of the medial left breast
demonstrate a persistent asymmetry measuring about 2 cm. There are
no suspicious associated features or clear underlying mass.

Mammographic images were processed with CAD.

Ultrasound of the upper inner quadrant of the left breast
demonstrates normal fibroglandular tissue. No masses or suspicious
areas of shadowing are identified.
IMPRESSION: There is an asymmetry without a sonographic correlate in the upper
inner quadrant of the left breast. This finding is likely benign.

RECOMMENDATION:
Six-month follow-up diagnostic left breast mammogram.

I have discussed the findings and recommendations with the patient.
If applicable, a reminder letter will be sent to the patient
regarding the next appointment.

BI-RADS CATEGORY  3: Probably benign.

## 2020-07-30 IMAGING — US US BREAST*L* LIMITED INC AXILLA
1 series · 3 of 3 positions shown · non-contrast
Comparison: Previous exam(s).

CLINICAL DATA: Screening recall from baseline for a left breast
asymmetry.

EXAM:
DIGITAL DIAGNOSTIC LEFT MAMMOGRAM WITH CAD AND TOMO
ULTRASOUND LEFT BREAST

[Series 1: us breast*left* limited inc axilla · 0.06mm/px · 3 of 3 slices shown]
[im 1/3]
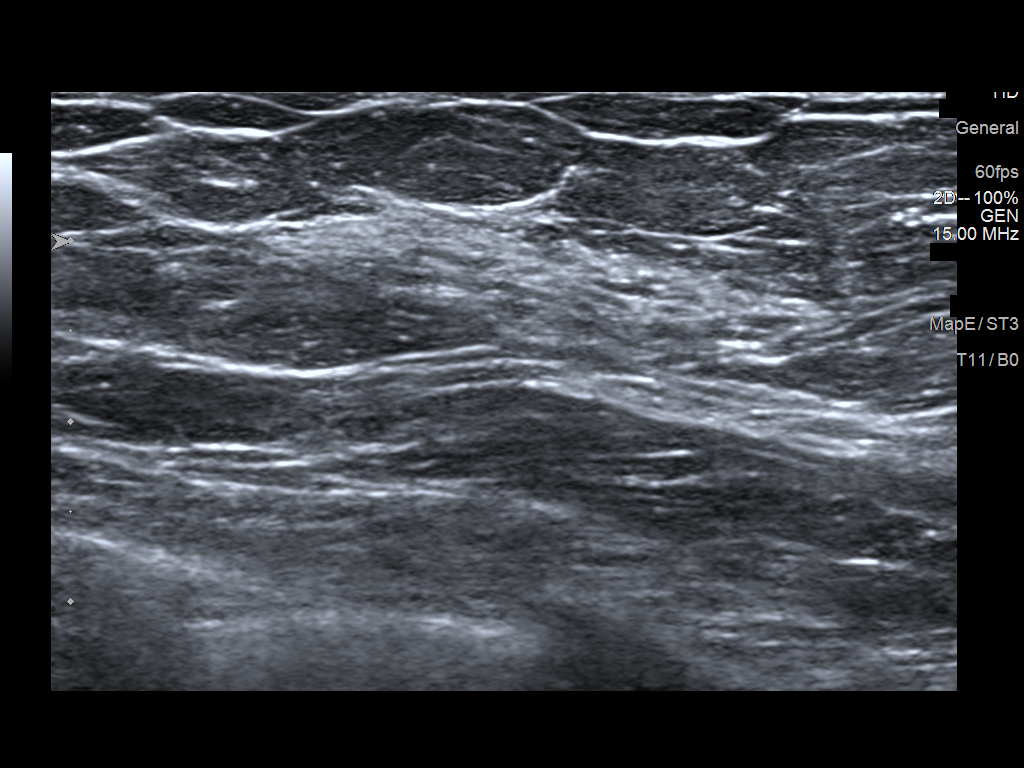
[im 2/3]
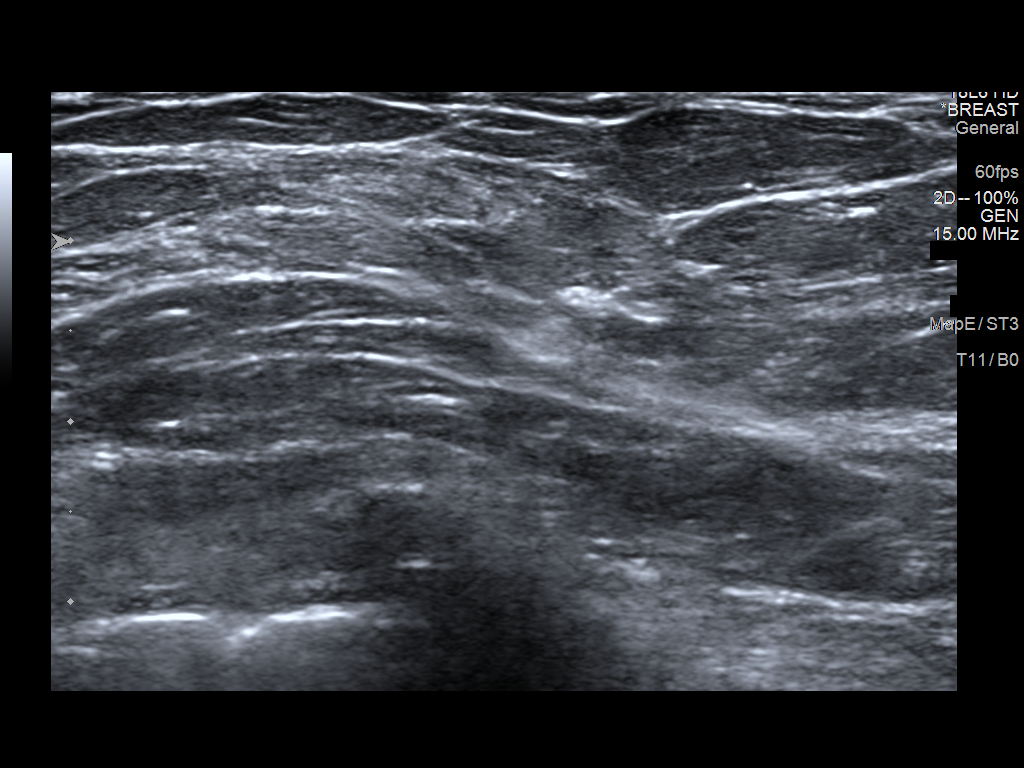
[im 3/3]
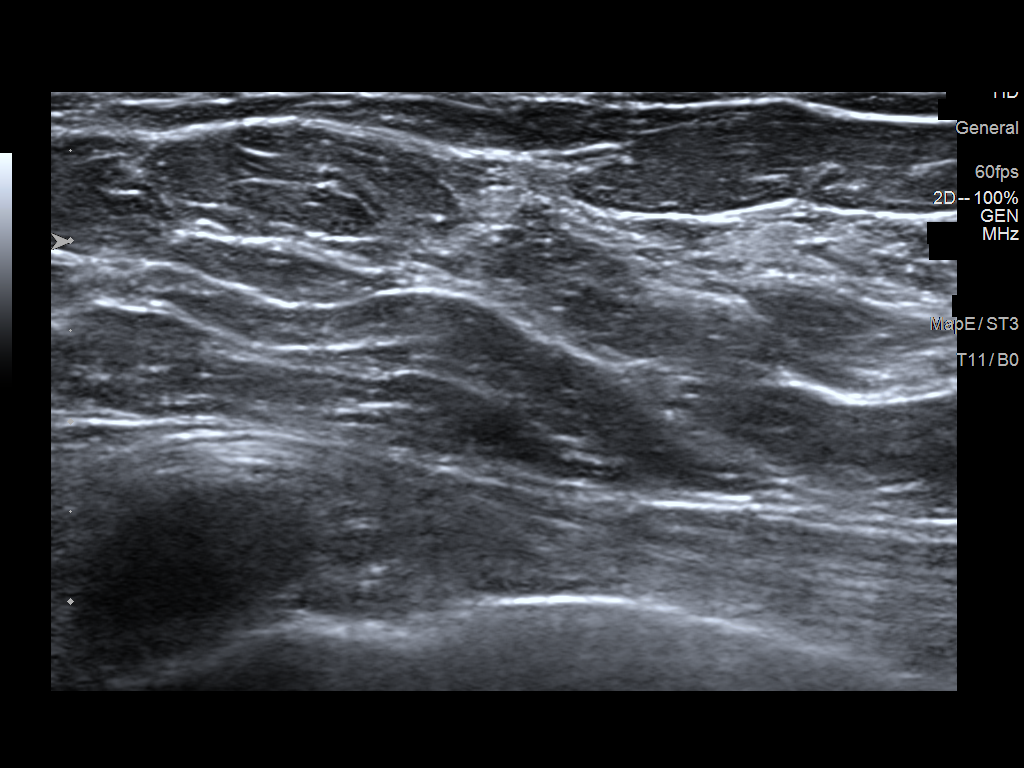

[3 of 3 positions shown; findings below may reference images not displayed]

ACR Breast Density Category b: There are scattered areas of
fibroglandular density.
FINDINGS: Spot compression tomosynthesis images of the medial left breast
demonstrate a persistent asymmetry measuring about 2 cm. There are
no suspicious associated features or clear underlying mass.

Mammographic images were processed with CAD.

Ultrasound of the upper inner quadrant of the left breast
demonstrates normal fibroglandular tissue. No masses or suspicious
areas of shadowing are identified.
IMPRESSION: There is an asymmetry without a sonographic correlate in the upper
inner quadrant of the left breast. This finding is likely benign.

RECOMMENDATION:
Six-month follow-up diagnostic left breast mammogram.

I have discussed the findings and recommendations with the patient.
If applicable, a reminder letter will be sent to the patient
regarding the next appointment.

BI-RADS CATEGORY  3: Probably benign.

## 2020-10-24 ENCOUNTER — Ambulatory Visit: Payer: Self-pay

## 2020-10-31 ENCOUNTER — Ambulatory Visit: Payer: Self-pay | Attending: Internal Medicine

## 2020-10-31 DIAGNOSIS — Z23 Encounter for immunization: Secondary | ICD-10-CM

## 2020-10-31 NOTE — Progress Notes (Signed)
   Covid-19 Vaccination Clinic  Name:  Cindy Stevens    MRN: [id number] DOB: [birth year]  10/31/2020  Cindy Stevens was observed post Covid-19 immunization for 15 minutes without incident. She was provided with Vaccine Information Sheet and instruction to access the V-Safe system.   Cindy Stevens was instructed to call 911 with any severe reactions post vaccine: Marland Kitchen Difficulty breathing  . Swelling of face and throat  . A fast heartbeat  . A bad rash all over body  . Dizziness and weakness

## 2022-07-21 ENCOUNTER — Encounter (HOSPITAL_COMMUNITY): Payer: Self-pay | Admitting: Emergency Medicine

## 2022-07-21 ENCOUNTER — Ambulatory Visit (HOSPITAL_COMMUNITY)
Admission: EM | Admit: 2022-07-21 | Discharge: 2022-07-21 | Disposition: A | Discharge status: Home / Self Care | Payer: PPO

## 2022-07-21 DIAGNOSIS — N764 Abscess of vulva: Secondary | ICD-10-CM | POA: Diagnosis not present

## 2022-07-21 MED ORDER — SULFAMETHOXAZOLE-TRIMETHOPRIM 800-160 MG PO TABS: 1.0000 | ORAL_TABLET | Freq: Two times a day (BID) | ORAL | 0 refills | Status: AC

## 2022-07-21 NOTE — ED Triage Notes (Signed)
Patient c/o cyst on the right lip of her vagina x 3 days.  No drainage.  Denies any OTC pain meds or warm compresses.

## 2022-07-21 NOTE — Discharge Instructions (Signed)
Start Bactrim DS twice daily for 7 days.  If you develop any rash or lesions stop the medication and be seen immediately.  This lesion appears to be healing.  Continue with your warm baths/warm compresses for symptom relief.  Follow-up with OB/GYN; call to schedule an appointment.  If anything worsens and the lesion becomes more painful, swollen, you develop any fever, nausea, vomiting you should be seen immediately.

## 2022-07-21 NOTE — ED Provider Notes (Signed)
[facility]    CSN: [id number] Arrival date & time: 07/21/22  1003      History   Chief Complaint Chief Complaint  Patient presents with   Abscess    HPI Cindy Stevens is a 66 y.o. female.   Patient presents today with a several day history of painful lesion of her right labia majora.  She denies any episodes of similar symptoms in the past or history of Bartholin cyst.  Reports that this was initially painful and more swollen but has been improving recently.  She has been doing sitz bath as well as warm compresses which has improved discomfort and swelling; denies any significant drainage.  She denies any recurrent skin infections or history of MRSA.  Denies history of diabetes or immunosuppression.  Denies any recent antibiotics.  She has not tried any over-the-counter medications for symptom management.  She does not follow with an OB/GYN.  Reports that she has significantly improved but wanted to ensure that symptoms would not worsen.  Denies any fever, nausea, vomiting.    Past Medical History:  Diagnosis Date   Arthritis    "left knee; hands, shoulders" (10/13/2017)   History of gastric ulcer    Hypertension    Rheumatoid arthritis [facility])     Patient Active Problem List   Diagnosis Date Noted   Osteoarthritis of left hip 10/13/2017   Avascular necrosis of hip, left (HCC) 10/13/2017    Past Surgical History:  Procedure Laterality Date   JOINT REPLACEMENT     TOTAL HIP ARTHROPLASTY Left 10/13/2017   TOTAL HIP ARTHROPLASTY Left 10/13/2017   Procedure: TOTAL HIP ARTHROPLASTY ANTERIOR APPROACH;  Surgeon: Samson Frederic, MD;  Location: [facility] OR;  Service: Orthopedics;  Laterality: Left;   TUBAL LIGATION      OB History   No obstetric history on file.      Home Medications    Prior to Admission medications   Medication Sig Start Date End Date Taking? Authorizing Provider  etanercept (ENBREL SURECLICK) 50 MG/ML injection as directed  Subcutaneous weekly for 30 days 03/11/22  Yes [provider]  hydrochlorothiazide (HYDRODIURIL) 12.5 MG tablet Take 12.5 mg by mouth daily. 08/28/17  Yes [provider]  Multiple Vitamins-Minerals (MULTIVITAMIN WITH MINERALS) tablet Take 1 tablet by mouth daily.   Yes [provider]  sulfamethoxazole-trimethoprim (BACTRIM DS) 800-160 MG tablet Take 1 tablet by mouth 2 (two) times daily for 7 days. 07/21/22 07/28/22 Yes Nelsie Domino, Noberto Retort, PA-C  aspirin 81 MG chewable tablet Chew 1 tablet (81 mg total) by mouth 2 (two) times daily. 10/14/17   Swinteck, Arlys John, MD  celecoxib (CELEBREX) 200 MG capsule Take 200 mg by mouth 2 (two) times daily as needed. 08/13/17   [provider]  docusate sodium (COLACE) 100 MG capsule Take 1 capsule (100 mg total) by mouth 2 (two) times daily. 10/14/17   Swinteck, Arlys John, MD  HYDROcodone-acetaminophen (NORCO/VICODIN) 5-325 MG tablet Take 1-2 tablets by mouth every 6 (six) hours as needed for moderate pain ((score 4 to 6)). 10/14/17   Swinteck, Arlys John, MD  ondansetron (ZOFRAN) 4 MG tablet Take 1 tablet (4 mg total) by mouth every 6 (six) hours as needed for nausea. 10/14/17   Swinteck, Arlys John, MD  senna (SENOKOT) 8.6 MG TABS tablet Take 2 tablets (17.2 mg total) by mouth at bedtime. 10/14/17   Samson Frederic, MD    Family History Family History  Problem Relation Age of Onset   Heart disease Sister  Diabetes Sister     Social History Social History   Tobacco Use   Smoking status: Never   Smokeless tobacco: Never  Vaping Use   Vaping Use: Never used  Substance Use Topics   Alcohol use: No    Alcohol/week: 0.0 standard drinks of alcohol   Drug use: No     Allergies   Patient has no known allergies.   Review of Systems Review of Systems  Constitutional:  Positive for activity change. Negative for appetite change, fatigue and fever.  Gastrointestinal:  Negative for abdominal pain, diarrhea, nausea and vomiting.   Genitourinary:  Positive for vaginal pain. Negative for dysuria, frequency, pelvic pain, urgency, vaginal bleeding and vaginal discharge.     Physical Exam Triage Vital Signs ED Triage Vitals  Enc Vitals Group     BP 07/21/22 1041 131/78     Pulse Rate 07/21/22 1041 73     Resp 07/21/22 1041 18     Temp 07/21/22 1041 98.3 F (36.8 C)     Temp Source 07/21/22 1041 Oral     SpO2 07/21/22 1041 100 %     Weight 07/21/22 1043 162 lb (73.5 kg)     Height 07/21/22 1043 5\' 3"  (1.6 m)     Head Circumference --      Peak Flow --      Pain Score 07/21/22 1043 0     Pain Loc --      Pain Edu? --      Excl. in GC? --    No data found.  Updated Vital Signs BP 131/78 (BP Location: Left Arm)   Pulse 73   Temp 98.3 F (36.8 C) (Oral)   Resp 18   Ht 5\' 3"  (1.6 m)   Wt 162 lb (73.5 kg)   SpO2 100%   BMI 28.70 kg/m   Visual Acuity Right Eye Distance:   Left Eye Distance:   Bilateral Distance:    Right Eye Near:   Left Eye Near:    Bilateral Near:     Physical Exam Vitals reviewed.  Constitutional:      General: She is awake. She is not in acute distress.    Appearance: Normal appearance. She is well-developed. She is not ill-appearing.     Comments: Very pleasant female appears stated age in no acute distress sitting comfortably in exam room  HENT:     Head: Normocephalic and atraumatic.  Cardiovascular:     Rate and Rhythm: Normal rate and regular rhythm.     Heart sounds: Normal heart sounds, S1 normal and S2 normal. No murmur heard. Pulmonary:     Effort: Pulmonary effort is normal.     Breath sounds: Normal breath sounds. No wheezing, rhonchi or rales.     Comments: Clear to auscultation bilaterally Abdominal:     General: Bowel sounds are normal.     Palpations: Abdomen is soft.     Tenderness: There is no abdominal tenderness. There is no right CVA tenderness, left CVA tenderness, guarding or rebound.     Comments: Benign abdominal exam  Genitourinary:     Labia:        Right: Lesion present. No rash.        Left: No rash, tenderness or lesion.        Comments: 1 cm indurated nodule with mild erythema.  No tenderness palpation.  No drainage noted.  No fluctuance or obvious fluid collection. Psychiatric:        Behavior:  Behavior is cooperative.      UC Treatments / Results  Labs (all labs ordered are listed, but only abnormal results are displayed) Labs Reviewed - No data to display  EKG   Radiology No results found.  Procedures Procedures (including critical care time)  Medications Ordered in UC Medications - No data to display  Initial Impression / Assessment and Plan / UC Course  I have reviewed the triage vital signs and the nursing notes.  Pertinent labs & imaging results that were available during my care of the patient were reviewed by me and considered in my medical decision making (see chart for details).     Lesion is improving and not amenable to I&D in clinic today.  Patient was encouraged to continue with conservative treatment measures including sitz bath's and warm compresses.  She was prescribed Bactrim DS with instruction to take daily for 1 week.  Discussed that if her symptoms change in any way and she has recurrent swelling of lesion, pain, fever she needs to be seen immediately at which point we would consider I&D if appropriate.  Recommended follow-up with OB/GYN and she was given contact information for local provider.  Strict return precautions given to which she expressed understanding.  Final Clinical Impressions(s) / UC Diagnoses   Final diagnoses:  Vulvar abscess     Discharge Instructions      Start Bactrim DS twice daily for 7 days.  If you develop any rash or lesions stop the medication and be seen immediately.  This lesion appears to be healing.  Continue with your warm baths/warm compresses for symptom relief.  Follow-up with OB/GYN; call to schedule an appointment.  If anything worsens  and the lesion becomes more painful, swollen, you develop any fever, nausea, vomiting you should be seen immediately.     ED Prescriptions     Medication Sig Dispense Auth. Provider   sulfamethoxazole-trimethoprim (BACTRIM DS) 800-160 MG tablet Take 1 tablet by mouth 2 (two) times daily for 7 days. 14 tablet Breyona Swander, Derry Skill, PA-C      PDMP not reviewed this encounter.   Terrilee Croak, PA-C 07/21/22 1113

## 2022-07-23 ENCOUNTER — Ambulatory Visit (HOSPITAL_COMMUNITY)
Admission: EM | Admit: 2022-07-23 | Discharge: 2022-07-23 | Disposition: A | Discharge status: Home / Self Care | Payer: PPO

## 2022-07-23 ENCOUNTER — Encounter (HOSPITAL_COMMUNITY): Payer: Self-pay

## 2022-07-23 DIAGNOSIS — T8090XA Unspecified complication following infusion and therapeutic injection, initial encounter: Secondary | ICD-10-CM | POA: Diagnosis not present

## 2022-07-23 NOTE — ED Triage Notes (Signed)
Pt states area of redness, itching on her right thigh. States it started this am. Pt on day 2  of Bactrim.

## 2022-07-23 NOTE — Discharge Instructions (Signed)
Please take a picture of your rash and share it with your rheumatologist. Let your rheumatologist know the area under the skin is a firm knot.

## 2022-07-23 NOTE — ED Provider Notes (Signed)
[facility]    CSN: [id number] Arrival date & time: 07/23/22  1741      History   Chief Complaint Chief Complaint  Patient presents with   Rash    HPI Cindy Stevens is a 66 y.o. female. Pt was dx with vulvar abscess 2 days ago here in [facility] and started on bactrim. Today noticed a red area on her R anterior thigh that is a little itchy. This area is the site of the enbrel injection she received at the rheumatologist's office last week. She gave herself an injection this morning on the same thigh but not in the red area. No other rash on body.    Rash   Past Medical History:  Diagnosis Date   Arthritis    "left knee; hands, shoulders" (10/13/2017)   History of gastric ulcer    Hypertension    Rheumatoid arthritis [facility])     Patient Active Problem List   Diagnosis Date Noted   Osteoarthritis of left hip 10/13/2017   Avascular necrosis of hip, left (HCC) 10/13/2017    Past Surgical History:  Procedure Laterality Date   JOINT REPLACEMENT     TOTAL HIP ARTHROPLASTY Left 10/13/2017   TOTAL HIP ARTHROPLASTY Left 10/13/2017   Procedure: TOTAL HIP ARTHROPLASTY ANTERIOR APPROACH;  Surgeon: Samson Frederic, MD;  Location: [facility] OR;  Service: Orthopedics;  Laterality: Left;   TUBAL LIGATION      OB History   No obstetric history on file.      Home Medications    Prior to Admission medications   Medication Sig Start Date End Date Taking? Authorizing Provider  aspirin 81 MG chewable tablet Chew 1 tablet (81 mg total) by mouth 2 (two) times daily. 10/14/17   Swinteck, Arlys John, MD  celecoxib (CELEBREX) 200 MG capsule Take 200 mg by mouth 2 (two) times daily as needed. 08/13/17   [provider]  docusate sodium (COLACE) 100 MG capsule Take 1 capsule (100 mg total) by mouth 2 (two) times daily. 10/14/17   Swinteck, Arlys John, MD  etanercept (ENBREL SURECLICK) 50 MG/ML injection as directed Subcutaneous weekly for 30 days 03/11/22   [provider]   hydrochlorothiazide (HYDRODIURIL) 12.5 MG tablet Take 12.5 mg by mouth daily. 08/28/17   [provider]  HYDROcodone-acetaminophen (NORCO/VICODIN) 5-325 MG tablet Take 1-2 tablets by mouth every 6 (six) hours as needed for moderate pain ((score 4 to 6)). 10/14/17   Swinteck, Arlys John, MD  Multiple Vitamins-Minerals (MULTIVITAMIN WITH MINERALS) tablet Take 1 tablet by mouth daily.    [provider]  ondansetron (ZOFRAN) 4 MG tablet Take 1 tablet (4 mg total) by mouth every 6 (six) hours as needed for nausea. 10/14/17   Swinteck, Arlys John, MD  senna (SENOKOT) 8.6 MG TABS tablet Take 2 tablets (17.2 mg total) by mouth at bedtime. 10/14/17   Swinteck, Arlys John, MD  sulfamethoxazole-trimethoprim (BACTRIM DS) 800-160 MG tablet Take 1 tablet by mouth 2 (two) times daily for 7 days. 07/21/22 07/28/22  Raspet, Noberto Retort, PA-C    Family History Family History  Problem Relation Age of Onset   Heart disease Sister    Diabetes Sister     Social History Social History   Tobacco Use   Smoking status: Never   Smokeless tobacco: Never  Vaping Use   Vaping Use: Never used  Substance Use Topics   Alcohol use: No    Alcohol/week: 0.0 standard drinks of alcohol   Drug use: No     Allergies  Patient has no known allergies.   Review of Systems Review of Systems  Skin:  Positive for rash.     Physical Exam Triage Vital Signs ED Triage Vitals [07/23/22 1820]  Enc Vitals Group     BP 137/85     Pulse Rate 92     Resp 16     Temp 98 F (36.7 C)     Temp Source Oral     SpO2 99 %     Weight      Height      Head Circumference      Peak Flow      Pain Score 0     Pain Loc      Pain Edu?      Excl. in GC?    No data found.  Updated Vital Signs BP 137/85 (BP Location: Right Arm)   Pulse 92   Temp 98 F (36.7 C) (Oral)   Resp 16   SpO2 99%   Visual Acuity Right Eye Distance:   Left Eye Distance:   Bilateral Distance:    Right Eye Near:   Left Eye Near:    Bilateral  Near:     Physical Exam Constitutional:      Appearance: Normal appearance.  Pulmonary:     Effort: Pulmonary effort is normal.  Skin:    Findings: Erythema present. No rash.       Neurological:     Mental Status: She is alert.      UC Treatments / Results  Labs (all labs ordered are listed, but only abnormal results are displayed) Labs Reviewed - No data to display  EKG   Radiology No results found.  Procedures Procedures (including critical care time)  Medications Ordered in UC Medications - No data to display  Initial Impression / Assessment and Plan / UC Course  I have reviewed the triage vital signs and the nursing notes.  Pertinent labs & imaging results that were available during my care of the patient were reviewed by me and considered in my medical decision making (see chart for details).    I suspect a local injection site reaction and advised pt to f/u with rheumatolgoy for guidance  Final Clinical Impressions(s) / UC Diagnoses   Final diagnoses:  Injection site reaction, initial encounter     Discharge Instructions      Please take a picture of your rash and share it with your rheumatologist. Let your rheumatologist know the area under the skin is a firm knot.    ED Prescriptions   None    PDMP not reviewed this encounter.   Cathlyn Parsons, NP 07/23/22 1910

## 2022-12-27 DIAGNOSIS — Z01419 Encounter for gynecological examination (general) (routine) without abnormal findings: Secondary | ICD-10-CM | POA: Diagnosis not present

## 2022-12-27 DIAGNOSIS — N952 Postmenopausal atrophic vaginitis: Secondary | ICD-10-CM | POA: Diagnosis not present

## 2022-12-27 DIAGNOSIS — Z124 Encounter for screening for malignant neoplasm of cervix: Secondary | ICD-10-CM | POA: Diagnosis not present

## 2022-12-27 DIAGNOSIS — Z1151 Encounter for screening for human papillomavirus (HPV): Secondary | ICD-10-CM | POA: Diagnosis not present

## 2023-05-09 DIAGNOSIS — Z111 Encounter for screening for respiratory tuberculosis: Secondary | ICD-10-CM | POA: Diagnosis not present

## 2023-05-09 DIAGNOSIS — R7989 Other specified abnormal findings of blood chemistry: Secondary | ICD-10-CM | POA: Diagnosis not present

## 2023-05-09 DIAGNOSIS — Z6826 Body mass index (BMI) 26.0-26.9, adult: Secondary | ICD-10-CM | POA: Diagnosis not present

## 2023-05-09 DIAGNOSIS — M1991 Primary osteoarthritis, unspecified site: Secondary | ICD-10-CM | POA: Diagnosis not present

## 2023-05-09 DIAGNOSIS — Z79899 Other long term (current) drug therapy: Secondary | ICD-10-CM | POA: Diagnosis not present

## 2023-05-09 DIAGNOSIS — R5383 Other fatigue: Secondary | ICD-10-CM | POA: Diagnosis not present

## 2023-05-09 DIAGNOSIS — E663 Overweight: Secondary | ICD-10-CM | POA: Diagnosis not present

## 2023-05-09 DIAGNOSIS — M0609 Rheumatoid arthritis without rheumatoid factor, multiple sites: Secondary | ICD-10-CM | POA: Diagnosis not present

## 2023-08-29 DIAGNOSIS — I10 Essential (primary) hypertension: Secondary | ICD-10-CM | POA: Diagnosis not present

## 2023-08-29 DIAGNOSIS — Z79899 Other long term (current) drug therapy: Secondary | ICD-10-CM | POA: Diagnosis not present

## 2023-08-29 DIAGNOSIS — D509 Iron deficiency anemia, unspecified: Secondary | ICD-10-CM | POA: Diagnosis not present

## 2023-08-29 DIAGNOSIS — E7800 Pure hypercholesterolemia, unspecified: Secondary | ICD-10-CM | POA: Diagnosis not present

## 2023-08-29 DIAGNOSIS — Z0001 Encounter for general adult medical examination with abnormal findings: Secondary | ICD-10-CM | POA: Diagnosis not present

## 2023-08-29 DIAGNOSIS — E2839 Other primary ovarian failure: Secondary | ICD-10-CM | POA: Diagnosis not present

## 2023-08-29 DIAGNOSIS — M0600 Rheumatoid arthritis without rheumatoid factor, unspecified site: Secondary | ICD-10-CM | POA: Diagnosis not present

## 2023-08-29 DIAGNOSIS — D84821 Immunodeficiency due to drugs: Secondary | ICD-10-CM | POA: Diagnosis not present

## 2023-08-29 DIAGNOSIS — Z23 Encounter for immunization: Secondary | ICD-10-CM | POA: Diagnosis not present

## 2023-09-01 ENCOUNTER — Other Ambulatory Visit: Payer: Self-pay | Admitting: Family Medicine

## 2023-09-01 DIAGNOSIS — Z1231 Encounter for screening mammogram for malignant neoplasm of breast: Secondary | ICD-10-CM

## 2023-09-03 ENCOUNTER — Other Ambulatory Visit: Payer: Self-pay | Admitting: Family Medicine

## 2023-09-03 DIAGNOSIS — E2839 Other primary ovarian failure: Secondary | ICD-10-CM

## 2023-09-13 ENCOUNTER — Encounter: Payer: Self-pay | Admitting: *Deleted

## 2023-09-13 ENCOUNTER — Other Ambulatory Visit: Payer: Self-pay

## 2023-09-13 ENCOUNTER — Ambulatory Visit
Admission: EM | Admit: 2023-09-13 | Discharge: 2023-09-13 | Disposition: A | Discharge status: Home / Self Care | Payer: HMO | Attending: Internal Medicine | Admitting: Internal Medicine

## 2023-09-13 DIAGNOSIS — U071 COVID-19: Secondary | ICD-10-CM | POA: Diagnosis not present

## 2023-09-13 DIAGNOSIS — R509 Fever, unspecified: Secondary | ICD-10-CM | POA: Diagnosis not present

## 2023-09-13 LAB — POCT URINALYSIS DIP (MANUAL ENTRY)
Glucose, UA: NEGATIVE mg/dL
Leukocytes, UA: NEGATIVE
Nitrite, UA: NEGATIVE
Protein Ur, POC: >=300 mg/dL — AB
Spec Grav, UA: >=1.03 — AB (ref 1.010–1.025)
Urobilinogen, UA: 1 E.U./dL
pH, UA: 6 (ref 5.0–8.0)

## 2023-09-13 LAB — POCT INFLUENZA A/B
Influenza A, POC: NEGATIVE
Influenza B, POC: NEGATIVE

## 2023-09-13 MED ORDER — ACETAMINOPHEN 325 MG PO TABS
975.0000 mg | ORAL_TABLET | Freq: Once | ORAL | Status: AC
  Administered 2023-09-13: 975 mg via ORAL

## 2023-09-13 NOTE — ED Provider Notes (Signed)
[facility]    CSN: [id number] Arrival date & time: 09/13/23  0848      History   Chief Complaint Chief Complaint  Patient presents with   Fever    HPI Cindy Stevens is a 67 y.o. female.   Patient presents with fever that started about 2 to 3 days ago.  Reports that she had a headache which prompted her to check her temperature.  Temp max at home was 103 at home.  She has been taking Tylenol for fever with last dose being last night.  She denies any nasal congestion, runny nose, sore throat, cough.  Denies dysuria, urinary frequency, hematuria, abdominal pain.  Denies nausea, vomiting, diarrhea.  She states that she otherwise feels well other than having a fever.  Denies body aches or chills.  Denies any known sick contacts.  Patient does take Enbrel.   Fever   Past Medical History:  Diagnosis Date   Arthritis    "left knee; hands, shoulders" (10/13/2017)   History of gastric ulcer    Hypertension    Rheumatoid arthritis [facility])     Patient Active Problem List   Diagnosis Date Noted   Osteoarthritis of left hip 10/13/2017   Avascular necrosis of hip, left (HCC) 10/13/2017    Past Surgical History:  Procedure Laterality Date   JOINT REPLACEMENT     TOTAL HIP ARTHROPLASTY Left 10/13/2017   TOTAL HIP ARTHROPLASTY Left 10/13/2017   Procedure: TOTAL HIP ARTHROPLASTY ANTERIOR APPROACH;  Surgeon: Samson Frederic, MD;  Location: [facility] OR;  Service: Orthopedics;  Laterality: Left;   TUBAL LIGATION      OB History   No obstetric history on file.      Home Medications    Prior to Admission medications   Medication Sig Start Date End Date Taking? Authorizing Provider  celecoxib (CELEBREX) 200 MG capsule Take 200 mg by mouth 2 (two) times daily as needed. 08/13/17  Yes [provider]  etanercept (ENBREL SURECLICK) 50 MG/ML injection as directed Subcutaneous weekly for 30 days 03/11/22  Yes [provider]  hydrochlorothiazide  (HYDRODIURIL) 12.5 MG tablet Take 12.5 mg by mouth daily. 08/28/17  Yes [provider]  aspirin 81 MG chewable tablet Chew 1 tablet (81 mg total) by mouth 2 (two) times daily. 10/14/17   Swinteck, Arlys John, MD  docusate sodium (COLACE) 100 MG capsule Take 1 capsule (100 mg total) by mouth 2 (two) times daily. 10/14/17   Swinteck, Arlys John, MD  HYDROcodone-acetaminophen (NORCO/VICODIN) 5-325 MG tablet Take 1-2 tablets by mouth every 6 (six) hours as needed for moderate pain ((score 4 to 6)). 10/14/17   Swinteck, Arlys John, MD  Multiple Vitamins-Minerals (MULTIVITAMIN WITH MINERALS) tablet Take 1 tablet by mouth daily.    [provider]  ondansetron (ZOFRAN) 4 MG tablet Take 1 tablet (4 mg total) by mouth every 6 (six) hours as needed for nausea. 10/14/17   Swinteck, Arlys John, MD  senna (SENOKOT) 8.6 MG TABS tablet Take 2 tablets (17.2 mg total) by mouth at bedtime. 10/14/17   Swinteck, Arlys John, MD    Family History Family History  Problem Relation Age of Onset   Heart disease Sister    Diabetes Sister     Social History Social History   Tobacco Use   Smoking status: Never   Smokeless tobacco: Never  Vaping Use   Vaping status: Never Used  Substance Use Topics   Alcohol use: No    Alcohol/week: 0.0 standard drinks of alcohol   Drug  use: No     Allergies   Patient has no known allergies.   Review of Systems Review of Systems Per HPI  Physical Exam Triage Vital Signs ED Triage Vitals  Encounter Vitals Group     BP 09/13/23 0857 121/80     Systolic BP Percentile --      Diastolic BP Percentile --      Pulse Rate 09/13/23 0857 (!) 102     Resp 09/13/23 0857 20     Temp 09/13/23 0857 (!) 103.1 F (39.5 C)     Temp Source 09/13/23 0857 Oral     SpO2 09/13/23 0857 97 %     Weight 09/13/23 0912 153 lb 6.4 oz (69.6 kg)     Height --      Head Circumference --      Peak Flow --      Pain Score 09/13/23 0858 0     Pain Loc --      Pain Education --      Exclude from  Growth Chart --    No data found.  Updated Vital Signs BP 121/80 (BP Location: Left Arm)   Pulse (!) 102   Temp (!) 101.1 F (38.4 C) (Oral)   Resp 20   Wt 153 lb 6.4 oz (69.6 kg)   SpO2 97%   BMI 27.17 kg/m   Visual Acuity Right Eye Distance:   Left Eye Distance:   Bilateral Distance:    Right Eye Near:   Left Eye Near:    Bilateral Near:     Physical Exam Constitutional:      General: She is not in acute distress.    Appearance: Normal appearance. She is not toxic-appearing or diaphoretic.  HENT:     Head: Normocephalic and atraumatic.     Right Ear: Tympanic membrane and ear canal normal.     Left Ear: Tympanic membrane and ear canal normal.     Nose: Nose normal.     Mouth/Throat:     Mouth: Mucous membranes are moist.     Pharynx: No posterior oropharyngeal erythema.  Eyes:     Extraocular Movements: Extraocular movements intact.     Conjunctiva/sclera: Conjunctivae normal.  Cardiovascular:     Rate and Rhythm: Normal rate and regular rhythm.     Pulses: Normal pulses.     Heart sounds: Normal heart sounds.  Pulmonary:     Effort: Pulmonary effort is normal. No respiratory distress.     Breath sounds: Normal breath sounds. No stridor. No wheezing, rhonchi or rales.  Abdominal:     General: Bowel sounds are normal. There is no distension.     Palpations: Abdomen is soft.     Tenderness: There is no abdominal tenderness.  Musculoskeletal:        General: Normal range of motion.     Cervical back: Normal range of motion.  Skin:    General: Skin is warm and dry.  Neurological:     General: No focal deficit present.     Mental Status: She is alert and oriented to person, place, and time. Mental status is at baseline.     Cranial Nerves: Cranial nerves 2-12 are intact.     Sensory: Sensation is intact.     Motor: Motor function is intact.     Coordination: Coordination is intact.     Gait: Gait is intact.  Psychiatric:        Mood and Affect: Mood  normal.  Behavior: Behavior normal.        Thought Content: Thought content normal.        Judgment: Judgment normal.      UC Treatments / Results  Labs (all labs ordered are listed, but only abnormal results are displayed) Labs Reviewed  POCT URINALYSIS DIP (MANUAL ENTRY) - Abnormal; Notable for the following components:      Result Value   Bilirubin, UA small (*)    Ketones, POC UA trace (5) (*)    Spec Grav, UA >=1.030 (*)    Blood, UA small (*)    Protein Ur, POC >=300 (*)    All other components within normal limits  SARS CORONAVIRUS 2 (TAT 6-24 HRS)  BASIC METABOLIC PANEL  CBC  POCT INFLUENZA A/B    EKG   Radiology No results found.  Procedures Procedures (including critical care time)  Medications Ordered in UC Medications  acetaminophen (TYLENOL) tablet 975 mg (975 mg Oral Given 09/13/23 0909)    Initial Impression / Assessment and Plan / UC Course  I have reviewed the triage vital signs and the nursing notes.  Pertinent labs & imaging results that were available during my care of the patient were reviewed by me and considered in my medical decision making (see chart for details).     Patient presenting with only chief complaint of fever and otherwise benign physical exam.  Patient is sitting upright in chair no acute distress and vital signs are stable.  I did discuss with patient that given that she takes Enbrel, there is concern for adverse reaction related to infection from fever.  Although, given she appears physically well, will defer ER evaluation.  Although, patient was advised of strict ER precautions if symptoms persist or worsen.  Tylenol administered in urgent care with improvement of fever to 101.1.  Rapid flu was negative.  UA unremarkable for any infection.  Will send BMP and CBC.  Awaiting results.  COVID test also pending.  Patient encouraged to follow-up with PCP and her rheumatologist to discuss recent fever given she takes Enbrel as  well.  Patient verbalized understanding and was agreeable with plan. Final Clinical Impressions(s) / UC Diagnoses   Final diagnoses:  Fever, unspecified     Discharge Instructions      Blood work and COVID test pending.  We will call if anything is abnormal.  I recommend that you go to the emergency department if fever persist or worsens.  Follow-up with your rheumatologist and PCP to let them know that you have had fever given that you take Enbrel.    ED Prescriptions   None    PDMP not reviewed this encounter.   Gustavus Bryant, [place] 09/13/23 1011

## 2023-09-13 NOTE — Discharge Instructions (Addendum)
Blood work and COVID test pending.  We will call if anything is abnormal.  I recommend that you go to the emergency department if fever persist or worsens.  Follow-up with your rheumatologist and PCP to let them know that you have had fever given that you take Enbrel.

## 2023-09-13 NOTE — ED Triage Notes (Addendum)
Pt reports temp 103 yesterday. States she has been taking tylenol every 4 hrs. (Last dose at 9pm). Denies other sx

## 2023-09-14 ENCOUNTER — Telehealth: Payer: Self-pay | Admitting: *Deleted

## 2023-09-14 LAB — CBC
Hematocrit: 35.7 % (ref 34.0–46.6)
Hemoglobin: 11.5 g/dL (ref 11.1–15.9)
MCH: 26.7 pg (ref 26.6–33.0)
MCHC: 32.2 g/dL (ref 31.5–35.7)
MCV: 83 fL (ref 79–97)
Platelets: 245 10*3/uL (ref 150–450)
RBC: 4.3 x10E6/uL (ref 3.77–5.28)
RDW: 13 % (ref 11.7–15.4)
WBC: 9.4 10*3/uL (ref 3.4–10.8)

## 2023-09-14 LAB — BASIC METABOLIC PANEL
BUN/Creatinine Ratio: 10 — ABNORMAL LOW (ref 12–28)
BUN: 12 mg/dL (ref 8–27)
CO2: 20 mmol/L (ref 20–29)
Calcium: 9.1 mg/dL (ref 8.7–10.3)
Chloride: 94 mmol/L — ABNORMAL LOW (ref 96–106)
Creatinine, Ser: 1.21 mg/dL — ABNORMAL HIGH (ref 0.57–1.00)
Glucose: 113 mg/dL — ABNORMAL HIGH (ref 70–99)
Potassium: 3.8 mmol/L (ref 3.5–5.2)
Sodium: 131 mmol/L — ABNORMAL LOW (ref 134–144)
eGFR: 49 mL/min/{1.73_m2} — ABNORMAL LOW (ref >59)

## 2023-09-14 LAB — SARS CORONAVIRUS 2 (TAT 6-24 HRS): SARS Coronavirus 2: POSITIVE — AB

## 2023-09-14 NOTE — Telephone Encounter (Signed)
Per Ervin Knack, NP, advised Cindy Stevens that her kidney function has worsened slightly since last check and her sodium is mildly low. Instructed to drink plenty of fluids and follow up with pcp asap for recheck. Also that her covid test positive and she should go to the ED for worsening symptoms. She can take Mucinex and Tylenol as needed for cough and fever

## 2023-09-14 NOTE — Telephone Encounter (Signed)
Called to inform pt of +covid test results. NA/LVM to return call

## 2023-09-14 NOTE — Telephone Encounter (Signed)
Call received from Ms. Sheppard asking if Paxlovid could be prescribed. Explained (per [facility], NP) that we could not prescribe Paxlovid due to her kidney function but Molnupiavir could be sent in and would need to be started today. Also advised her to contact her PCP to discuss further medications and for f/u care. She declined Molnupiavir because her pharmacy is closed today and the 24hr pharmacy is too far for her to drive. She stated that she would contact her PCP now.

## 2023-09-19 ENCOUNTER — Ambulatory Visit: Payer: PPO

## 2023-09-24 ENCOUNTER — Ambulatory Visit: Payer: PPO

## 2023-10-06 ENCOUNTER — Other Ambulatory Visit: Payer: Self-pay | Admitting: Family Medicine

## 2023-10-06 DIAGNOSIS — N6489 Other specified disorders of breast: Secondary | ICD-10-CM

## 2023-10-06 DIAGNOSIS — Z1231 Encounter for screening mammogram for malignant neoplasm of breast: Secondary | ICD-10-CM

## 2023-10-08 ENCOUNTER — Ambulatory Visit: Payer: PPO

## 2023-10-17 ENCOUNTER — Ambulatory Visit
Admission: RE | Admit: 2023-10-17 | Discharge: 2023-10-17 | Disposition: A | Discharge status: Home / Self Care | Payer: HMO | Source: Ambulatory Visit | Attending: Family Medicine | Admitting: Family Medicine

## 2023-10-17 ENCOUNTER — Other Ambulatory Visit: Payer: Self-pay | Admitting: Family Medicine

## 2023-10-17 DIAGNOSIS — N6489 Other specified disorders of breast: Secondary | ICD-10-CM

## 2023-10-17 DIAGNOSIS — R921 Mammographic calcification found on diagnostic imaging of breast: Secondary | ICD-10-CM | POA: Diagnosis not present

## 2023-10-17 DIAGNOSIS — Z1231 Encounter for screening mammogram for malignant neoplasm of breast: Secondary | ICD-10-CM

## 2023-10-17 DIAGNOSIS — N6320 Unspecified lump in the left breast, unspecified quadrant: Secondary | ICD-10-CM | POA: Diagnosis not present

## 2023-10-17 DIAGNOSIS — R928 Other abnormal and inconclusive findings on diagnostic imaging of breast: Secondary | ICD-10-CM | POA: Diagnosis not present

## 2023-10-22 ENCOUNTER — Other Ambulatory Visit: Payer: HMO

## 2023-10-23 ENCOUNTER — Ambulatory Visit
Admission: RE | Admit: 2023-10-23 | Discharge: 2023-10-23 | Disposition: A | Discharge status: Home / Self Care | Payer: HMO | Source: Ambulatory Visit | Attending: Family Medicine | Admitting: Family Medicine

## 2023-10-23 DIAGNOSIS — N6032 Fibrosclerosis of left breast: Secondary | ICD-10-CM | POA: Diagnosis not present

## 2023-10-23 DIAGNOSIS — N6320 Unspecified lump in the left breast, unspecified quadrant: Secondary | ICD-10-CM

## 2023-10-23 DIAGNOSIS — N6321 Unspecified lump in the left breast, upper outer quadrant: Secondary | ICD-10-CM | POA: Diagnosis not present

## 2023-10-23 HISTORY — PX: BREAST BIOPSY: SHX20

## 2023-10-24 LAB — SURGICAL PATHOLOGY

## 2023-11-12 DIAGNOSIS — H25813 Combined forms of age-related cataract, bilateral: Secondary | ICD-10-CM | POA: Diagnosis not present

## 2023-11-12 DIAGNOSIS — H0102B Squamous blepharitis left eye, upper and lower eyelids: Secondary | ICD-10-CM | POA: Diagnosis not present

## 2023-11-12 DIAGNOSIS — H10413 Chronic giant papillary conjunctivitis, bilateral: Secondary | ICD-10-CM | POA: Diagnosis not present

## 2023-11-12 DIAGNOSIS — H40033 Anatomical narrow angle, bilateral: Secondary | ICD-10-CM | POA: Diagnosis not present

## 2023-11-12 DIAGNOSIS — H0102A Squamous blepharitis right eye, upper and lower eyelids: Secondary | ICD-10-CM | POA: Diagnosis not present

## 2023-12-23 DIAGNOSIS — H40033 Anatomical narrow angle, bilateral: Secondary | ICD-10-CM | POA: Diagnosis not present

## 2023-12-29 DIAGNOSIS — M0609 Rheumatoid arthritis without rheumatoid factor, multiple sites: Secondary | ICD-10-CM | POA: Diagnosis not present

## 2024-03-08 DIAGNOSIS — M0609 Rheumatoid arthritis without rheumatoid factor, multiple sites: Secondary | ICD-10-CM | POA: Diagnosis not present

## 2024-03-08 DIAGNOSIS — Z6825 Body mass index (BMI) 25.0-25.9, adult: Secondary | ICD-10-CM | POA: Diagnosis not present

## 2024-03-08 DIAGNOSIS — Z79899 Other long term (current) drug therapy: Secondary | ICD-10-CM | POA: Diagnosis not present

## 2024-03-08 DIAGNOSIS — R7989 Other specified abnormal findings of blood chemistry: Secondary | ICD-10-CM | POA: Diagnosis not present

## 2024-03-08 DIAGNOSIS — R5383 Other fatigue: Secondary | ICD-10-CM | POA: Diagnosis not present

## 2024-03-08 DIAGNOSIS — E663 Overweight: Secondary | ICD-10-CM | POA: Diagnosis not present

## 2024-03-08 DIAGNOSIS — M1991 Primary osteoarthritis, unspecified site: Secondary | ICD-10-CM | POA: Diagnosis not present

## 2024-03-12 ENCOUNTER — Other Ambulatory Visit: Payer: PPO

## 2024-05-12 DIAGNOSIS — L659 Nonscarring hair loss, unspecified: Secondary | ICD-10-CM | POA: Diagnosis not present

## 2024-05-12 DIAGNOSIS — M79672 Pain in left foot: Secondary | ICD-10-CM | POA: Diagnosis not present

## 2024-05-12 DIAGNOSIS — M79641 Pain in right hand: Secondary | ICD-10-CM | POA: Diagnosis not present

## 2024-05-12 DIAGNOSIS — M79642 Pain in left hand: Secondary | ICD-10-CM | POA: Diagnosis not present

## 2024-05-12 DIAGNOSIS — M1990 Unspecified osteoarthritis, unspecified site: Secondary | ICD-10-CM | POA: Diagnosis not present

## 2024-05-12 DIAGNOSIS — M2550 Pain in unspecified joint: Secondary | ICD-10-CM | POA: Diagnosis not present

## 2024-05-12 DIAGNOSIS — M0609 Rheumatoid arthritis without rheumatoid factor, multiple sites: Secondary | ICD-10-CM | POA: Diagnosis not present

## 2024-05-12 DIAGNOSIS — Z79899 Other long term (current) drug therapy: Secondary | ICD-10-CM | POA: Diagnosis not present

## 2024-05-12 DIAGNOSIS — M79671 Pain in right foot: Secondary | ICD-10-CM | POA: Diagnosis not present

## 2024-06-27 ENCOUNTER — Ambulatory Visit (HOSPITAL_COMMUNITY): Admission: EM | Admit: 2024-06-27 | Discharge: 2024-06-27 | Disposition: A | Discharge status: Home / Self Care

## 2024-06-27 ENCOUNTER — Encounter (HOSPITAL_COMMUNITY): Payer: Self-pay

## 2024-06-27 DIAGNOSIS — J069 Acute upper respiratory infection, unspecified: Secondary | ICD-10-CM

## 2024-06-27 MED ORDER — BENZONATATE 100 MG PO CAPS: 100.0000 mg | ORAL_CAPSULE | Freq: Three times a day (TID) | ORAL | 0 refills | Status: AC | PRN

## 2024-06-27 MED ORDER — ALBUTEROL SULFATE (2.5 MG/3ML) 0.083% IN NEBU
2.5000 mg | INHALATION_SOLUTION | Freq: Once | RESPIRATORY_TRACT | Status: AC
  Administered 2024-06-27: 2.5 mg via RESPIRATORY_TRACT

## 2024-06-27 MED ORDER — ALBUTEROL SULFATE (2.5 MG/3ML) 0.083% IN NEBU
INHALATION_SOLUTION | RESPIRATORY_TRACT | Status: AC
  Filled 2024-06-27: qty 3

## 2024-06-27 MED ORDER — MUCINEX DM MAXIMUM STRENGTH 60-1200 MG PO TB12: 1.0000 | ORAL_TABLET | Freq: Two times a day (BID) | ORAL | 0 refills | Status: AC

## 2024-06-27 NOTE — ED Provider Notes (Signed)
 [facility]    CSN: [id number] Arrival date & time: 06/27/24  1007      History   Chief Complaint Chief Complaint  Patient presents with   Cough    HPI Cindy Stevens is a 68 y.o. female.   Discussed the use of AI scribe software for clinical note transcription with the patient, who gave verbal consent to proceed.   The patient presents with a persistent cough that has been ongoing for 3-4 days. The cough is described as a tickle in the throat, initially thought to be a cold. It is predominantly dry, though the patient feels like there is mucus present that is difficult to expel. The patient reports occasional successful expectoration when able to catch it. She has been taking DayQuil for symptom relief, which provides temporary soothing but wears off, causing the cough to return. The patient denies associated symptoms such as runny nose, nasal congestion, sneezing, sore throat, fevers, chills, body aches, headache, wheezing, or shortness of breath. The patient reports no new medications. She does not smoke or vape. The cough does not appear to be exacerbated by position, as the patient denies feeling drainage when lying down.   The following portions of the patient's history were reviewed and updated as appropriate: allergies, current medications, past family history, past medical history, past social history, past surgical history, and problem list.    Past Medical History:  Diagnosis Date   Arthritis    left knee; hands, shoulders (10/13/2017)   History of gastric ulcer    Hypertension    Rheumatoid arthritis [facility])     Patient Active Problem List   Diagnosis Date Noted   Osteoarthritis of left hip 10/13/2017   Avascular necrosis of hip, left (HCC) 10/13/2017    Past Surgical History:  Procedure Laterality Date   BREAST BIOPSY Left 10/23/2023   US  LT BREAST BX W LOC DEV 1ST LESION IMG BX SPEC US  GUIDE 10/23/2023 [facility]   JOINT  REPLACEMENT     TOTAL HIP ARTHROPLASTY Left 10/13/2017   TOTAL HIP ARTHROPLASTY Left 10/13/2017   Procedure: TOTAL HIP ARTHROPLASTY ANTERIOR APPROACH;  Surgeon: Fidel Rogue, MD;  Location: [facility] OR;  Service: Orthopedics;  Laterality: Left;   TUBAL LIGATION      OB History   No obstetric history on file.      Home Medications    Prior to Admission medications   Medication Sig Start Date End Date Taking? Authorizing Provider  benzonatate  (TESSALON ) 100 MG capsule Take 1 capsule (100 mg total) by mouth 3 (three) times daily as needed for cough. 06/27/24  Yes Jenisse Vullo, FNP  clotrimazole-betamethasone (LOTRISONE) cream Apply 1 Application topically. 05/01/21  Yes [provider]  Dextromethorphan-guaiFENesin  (MUCINEX  DM MAXIMUM STRENGTH) 60-1200 MG TB12 Take 1 tablet by mouth 2 (two) times daily. 06/27/24  Yes Iola Lukes, FNP  folic acid (FOLVITE) 1 MG tablet Take 2 mg by mouth daily. 06/14/24  Yes [provider]  leucovorin (WELLCOVORIN) 5 MG tablet Take 5 mg by mouth once a week. 06/12/24  Yes [provider]  methotrexate (RHEUMATREX) 2.5 MG tablet Take 10 mg by mouth once a week. 06/09/24  Yes [provider]  celecoxib (CELEBREX) 200 MG capsule Take 200 mg by mouth 2 (two) times daily as needed. 08/13/17   [provider]  hydrochlorothiazide  (HYDRODIURIL ) 12.5 MG tablet Take 12.5 mg by mouth daily. 08/28/17   [provider]  Multiple Vitamins-Minerals (MULTIVITAMIN WITH MINERALS) tablet Take 1 tablet  by mouth daily.    [provider]    Family History Family History  Problem Relation Age of Onset   Heart disease Sister    Diabetes Sister     Social History Social History   Tobacco Use   Smoking status: Never   Smokeless tobacco: Never  Vaping Use   Vaping status: Never Used  Substance Use Topics   Alcohol use: No    Alcohol/week: 0.0 standard drinks of alcohol   Drug use: No     Allergies    Methotrexate and Valacyclovir   Review of Systems Review of Systems  Constitutional:  Negative for fever.  HENT:  Negative for congestion, postnasal drip, rhinorrhea, sneezing and sore throat.   Respiratory:  Positive for cough. Negative for shortness of breath and wheezing.   Gastrointestinal:  Negative for diarrhea, nausea and vomiting.  Musculoskeletal:  Negative for myalgias.  Neurological:  Negative for headaches.  All other systems reviewed and are negative.    Physical Exam Triage Vital Signs ED Triage Vitals  Encounter Vitals Group     BP 06/27/24 1035 (!) 159/99     Girls Systolic BP Percentile --      Girls Diastolic BP Percentile --      Boys Systolic BP Percentile --      Boys Diastolic BP Percentile --      Pulse Rate 06/27/24 1035 89     Resp 06/27/24 1035 16     Temp 06/27/24 1035 98.1 F (36.7 C)     Temp Source 06/27/24 1035 Oral     SpO2 06/27/24 1035 97 %     Weight --      Height --      Head Circumference --      Peak Flow --      Pain Score 06/27/24 1036 0     Pain Loc --      Pain Education --      Exclude from Growth Chart --    No data found.  Updated Vital Signs BP (!) 159/99 (BP Location: Left Arm)   Pulse 89   Temp 98.1 F (36.7 C) (Oral)   Resp 16   SpO2 97%   Visual Acuity Right Eye Distance:   Left Eye Distance:   Bilateral Distance:    Right Eye Near:   Left Eye Near:    Bilateral Near:     Physical Exam Vitals reviewed.  Constitutional:      General: She is awake. She is not in acute distress.    Appearance: Normal appearance. She is well-developed and well-groomed. She is not ill-appearing, toxic-appearing or diaphoretic.  HENT:     Head: Normocephalic.     Right Ear: Tympanic membrane, ear canal and external ear normal. No drainage, swelling or tenderness. No middle ear effusion. Tympanic membrane is not erythematous.     Left Ear: Tympanic membrane, ear canal and external ear normal. No drainage, swelling or  tenderness.  No middle ear effusion. Tympanic membrane is not erythematous.     Nose: Nose normal. No congestion or rhinorrhea.     Mouth/Throat:     Lips: Pink.     Mouth: Mucous membranes are moist.     Pharynx: No pharyngeal swelling, oropharyngeal exudate, posterior oropharyngeal erythema or uvula swelling.     Tonsils: No tonsillar exudate or tonsillar abscesses.  Eyes:     General: Vision grossly intact.     Conjunctiva/sclera: Conjunctivae normal.  Cardiovascular:  Rate and Rhythm: Normal rate.     Heart sounds: Normal heart sounds.  Pulmonary:     Effort: Pulmonary effort is normal. No tachypnea or respiratory distress.     Breath sounds: Normal breath sounds and air entry.  Musculoskeletal:        General: Normal range of motion.     Cervical back: Normal range of motion and neck supple.     Right lower leg: No edema.     Left lower leg: No edema.  Lymphadenopathy:     Cervical: No cervical adenopathy.  Skin:    General: Skin is warm and dry.  Neurological:     General: No focal deficit present.     Mental Status: She is alert and oriented to person, place, and time.  Psychiatric:        Behavior: Behavior is cooperative.      UC Treatments / Results  Labs (all labs ordered are listed, but only abnormal results are displayed) Labs Reviewed - No data to display  EKG   Radiology No results found.  Procedures Procedures (including critical care time)  Medications Ordered in UC Medications  albuterol  (PROVENTIL ) (2.5 MG/3ML) 0.083% nebulizer solution 2.5 mg (2.5 mg Nebulization Given 06/27/24 1109)    Initial Impression / Assessment and Plan / UC Course  I have reviewed the triage vital signs and the nursing notes.  Pertinent labs & imaging results that were available during my care of the patient were reviewed by me and considered in my medical decision making (see chart for details).     Patient presents with a 3 to 4-day history of a persistent,  nonproductive dry cough described as a tickle in the throat. She denies associated symptoms including fever, chills, body aches, headache, nasal congestion, runny nose, sore throat, wheezing, or shortness of breath. She does not smoke or vape. Physical exam is unremarkable, and she appears afebrile and nontoxic. The etiology may be related to postnasal drip or environmental irritants despite no sensation of drainage. Albuterol  nebulizer treatment will be administered to assess response. Mucinex  DM twice daily and Tessalon  pearls as needed for cough suppression. Recommend humidifier use at home to add moisture to the air and potentially reduce throat irritation. Follow up with PCP if symptoms persist or worsen. ED evaluation advised for development of high fever, respiratory distress, or chest pain.  Today's evaluation has revealed no signs of a dangerous process. Discussed diagnosis with patient and/or guardian. Patient and/or guardian aware of their diagnosis, possible red flag symptoms to watch out for and need for close follow up. Patient and/or guardian understands verbal and written discharge instructions. Patient and/or guardian comfortable with plan and disposition.  Patient and/or guardian has a clear mental status at this time, good insight into illness (after discussion and teaching) and has clear judgment to make decisions regarding their care  Documentation was completed with the aid of voice recognition software. Transcription may contain typographical errors. Final Clinical Impressions(s) / UC Diagnoses   Final diagnoses:  Viral URI with cough     Discharge Instructions      You have been evaluated today for a dry, persistent cough lasting several days. Your exam was normal, and you do not have signs of a serious infection. The cough may be caused by mild throat irritation or environmental factors. To help manage your symptoms at home, you may take Mucinex  DM twice a day to loosen mucus  and reduce coughing, and Tessalon  pearls as needed to  suppress the cough. Using a humidifier can help add moisture to the air, which may soothe throat irritation and ease your symptoms. You were given an albuterol  nebulizer treatment during your visit to help open your airways and improve any hidden mucus buildup.  Please follow up with your primary care provider if your symptoms do not improve within the next several days, or if the cough worsens.   Seek immediate medical attention at the emergency department if you develop a high fever, difficulty breathing, chest pain, wheezing, or feel significantly unwell.      ED Prescriptions     Medication Sig Dispense Auth. Provider   Dextromethorphan-guaiFENesin  (MUCINEX  DM MAXIMUM STRENGTH) 60-1200 MG TB12 Take 1 tablet by mouth 2 (two) times daily. 20 tablet Iola Lukes, FNP   benzonatate  (TESSALON ) 100 MG capsule Take 1 capsule (100 mg total) by mouth 3 (three) times daily as needed for cough. 21 capsule Iola Lukes, FNP      PDMP not reviewed this encounter.   Iola Lukes, [place] 06/27/24 1110

## 2024-06-27 NOTE — Discharge Instructions (Addendum)
 You have been evaluated today for a dry, persistent cough lasting several days. Your exam was normal, and you do not have signs of a serious infection. The cough may be caused by mild throat irritation or environmental factors. To help manage your symptoms at home, you may take Mucinex  DM twice a day to loosen mucus and reduce coughing, and Tessalon  pearls as needed to suppress the cough. Using a humidifier can help add moisture to the air, which may soothe throat irritation and ease your symptoms. You were given an albuterol  nebulizer treatment during your visit to help open your airways and improve any hidden mucus buildup.  Please follow up with your primary care provider if your symptoms do not improve within the next several days, or if the cough worsens.   Seek immediate medical attention at the emergency department if you develop a high fever, difficulty breathing, chest pain, wheezing, or feel significantly unwell.

## 2024-06-27 NOTE — ED Triage Notes (Signed)
 Patient here today with c/o cough X 4 days. She has taken Dayquil with little relief. No known sick contacts. 2 weeks ago patient was at a reunion.

## 2024-08-02 ENCOUNTER — Other Ambulatory Visit: Payer: Self-pay | Admitting: Family Medicine

## 2024-08-02 DIAGNOSIS — R921 Mammographic calcification found on diagnostic imaging of breast: Secondary | ICD-10-CM

## 2024-08-10 ENCOUNTER — Other Ambulatory Visit: Payer: Self-pay | Admitting: Family Medicine

## 2024-08-10 DIAGNOSIS — R921 Mammographic calcification found on diagnostic imaging of breast: Secondary | ICD-10-CM

## 2024-08-10 DIAGNOSIS — Z09 Encounter for follow-up examination after completed treatment for conditions other than malignant neoplasm: Secondary | ICD-10-CM

## 2024-08-11 ENCOUNTER — Encounter

## 2024-08-11 ENCOUNTER — Other Ambulatory Visit

## 2024-08-11 DIAGNOSIS — M1990 Unspecified osteoarthritis, unspecified site: Secondary | ICD-10-CM | POA: Diagnosis not present

## 2024-08-11 DIAGNOSIS — L659 Nonscarring hair loss, unspecified: Secondary | ICD-10-CM | POA: Diagnosis not present

## 2024-08-11 DIAGNOSIS — M0609 Rheumatoid arthritis without rheumatoid factor, multiple sites: Secondary | ICD-10-CM | POA: Diagnosis not present

## 2024-08-11 DIAGNOSIS — M2550 Pain in unspecified joint: Secondary | ICD-10-CM | POA: Diagnosis not present

## 2024-08-11 DIAGNOSIS — Z79899 Other long term (current) drug therapy: Secondary | ICD-10-CM | POA: Diagnosis not present

## 2024-08-31 DIAGNOSIS — Z0000 Encounter for general adult medical examination without abnormal findings: Secondary | ICD-10-CM | POA: Diagnosis not present

## 2024-08-31 DIAGNOSIS — Z1331 Encounter for screening for depression: Secondary | ICD-10-CM | POA: Diagnosis not present

## 2024-08-31 DIAGNOSIS — E7800 Pure hypercholesterolemia, unspecified: Secondary | ICD-10-CM | POA: Diagnosis not present

## 2024-08-31 DIAGNOSIS — E2839 Other primary ovarian failure: Secondary | ICD-10-CM | POA: Diagnosis not present

## 2024-08-31 DIAGNOSIS — Z1159 Encounter for screening for other viral diseases: Secondary | ICD-10-CM | POA: Diagnosis not present

## 2024-08-31 DIAGNOSIS — Z23 Encounter for immunization: Secondary | ICD-10-CM | POA: Diagnosis not present

## 2024-08-31 DIAGNOSIS — Z1211 Encounter for screening for malignant neoplasm of colon: Secondary | ICD-10-CM | POA: Diagnosis not present

## 2024-08-31 DIAGNOSIS — M0600 Rheumatoid arthritis without rheumatoid factor, unspecified site: Secondary | ICD-10-CM | POA: Diagnosis not present

## 2024-08-31 DIAGNOSIS — D509 Iron deficiency anemia, unspecified: Secondary | ICD-10-CM | POA: Diagnosis not present

## 2024-08-31 DIAGNOSIS — I10 Essential (primary) hypertension: Secondary | ICD-10-CM | POA: Diagnosis not present

## 2024-09-01 DIAGNOSIS — Z961 Presence of intraocular lens: Secondary | ICD-10-CM | POA: Diagnosis not present

## 2024-09-01 DIAGNOSIS — H10413 Chronic giant papillary conjunctivitis, bilateral: Secondary | ICD-10-CM | POA: Diagnosis not present

## 2024-09-01 DIAGNOSIS — H0102A Squamous blepharitis right eye, upper and lower eyelids: Secondary | ICD-10-CM | POA: Diagnosis not present

## 2024-09-01 DIAGNOSIS — H0102B Squamous blepharitis left eye, upper and lower eyelids: Secondary | ICD-10-CM | POA: Diagnosis not present

## 2024-09-02 ENCOUNTER — Ambulatory Visit (HOSPITAL_BASED_OUTPATIENT_CLINIC_OR_DEPARTMENT_OTHER)
Admission: RE | Admit: 2024-09-02 | Discharge: 2024-09-02 | Disposition: A | Discharge status: Home / Self Care | Source: Ambulatory Visit | Attending: Family Medicine | Admitting: Family Medicine

## 2024-09-02 ENCOUNTER — Other Ambulatory Visit: Payer: Self-pay

## 2024-09-02 ENCOUNTER — Other Ambulatory Visit (HOSPITAL_COMMUNITY): Payer: Self-pay

## 2024-09-02 ENCOUNTER — Other Ambulatory Visit: Payer: PPO

## 2024-09-02 DIAGNOSIS — Z780 Asymptomatic menopausal state: Secondary | ICD-10-CM | POA: Diagnosis not present

## 2024-09-02 DIAGNOSIS — E2839 Other primary ovarian failure: Secondary | ICD-10-CM | POA: Insufficient documentation

## 2024-09-02 DIAGNOSIS — M810 Age-related osteoporosis without current pathological fracture: Secondary | ICD-10-CM | POA: Diagnosis not present

## 2024-09-02 MED ORDER — FOLIC ACID 1 MG PO TABS
2.0000 mg | ORAL_TABLET | Freq: Every day | ORAL | 3 refills | Status: AC
  Filled 2024-10-24: qty 60, 30d supply, fill #0
  Filled 2025-01-17: qty 60, 30d supply, fill #1

## 2024-09-02 MED ORDER — LEUCOVORIN CALCIUM 5 MG PO TABS: 5.0000 mg | ORAL_TABLET | ORAL | 0 refills | Status: AC

## 2024-09-02 MED ORDER — HYDROCHLOROTHIAZIDE 12.5 MG PO TABS
12.5000 mg | ORAL_TABLET | Freq: Every morning | ORAL | 3 refills | Status: AC
  Filled 2024-09-02 – 2024-10-24 (×3): qty 90, 90d supply, fill #0
  Filled 2025-01-17: qty 90, 90d supply, fill #1

## 2024-09-06 ENCOUNTER — Other Ambulatory Visit (HOSPITAL_COMMUNITY): Payer: Self-pay

## 2024-10-05 DIAGNOSIS — M1990 Unspecified osteoarthritis, unspecified site: Secondary | ICD-10-CM | POA: Diagnosis not present

## 2024-10-05 DIAGNOSIS — Z79899 Other long term (current) drug therapy: Secondary | ICD-10-CM | POA: Diagnosis not present

## 2024-10-05 DIAGNOSIS — M0609 Rheumatoid arthritis without rheumatoid factor, multiple sites: Secondary | ICD-10-CM | POA: Diagnosis not present

## 2024-10-05 DIAGNOSIS — M810 Age-related osteoporosis without current pathological fracture: Secondary | ICD-10-CM | POA: Diagnosis not present

## 2024-10-05 DIAGNOSIS — L659 Nonscarring hair loss, unspecified: Secondary | ICD-10-CM | POA: Diagnosis not present

## 2024-10-05 DIAGNOSIS — M2550 Pain in unspecified joint: Secondary | ICD-10-CM | POA: Diagnosis not present

## 2024-10-06 ENCOUNTER — Other Ambulatory Visit (HOSPITAL_COMMUNITY): Payer: Self-pay

## 2024-10-06 ENCOUNTER — Other Ambulatory Visit: Payer: Self-pay

## 2024-10-06 MED ORDER — ALENDRONATE SODIUM 70 MG PO TABS
70.0000 mg | ORAL_TABLET | ORAL | 2 refills | Status: DC
  Filled 2024-10-06: qty 4, 28d supply, fill #0
  Filled 2024-10-30: qty 4, 28d supply, fill #1
  Filled 2024-12-02: qty 4, 28d supply, fill #2

## 2024-10-08 ENCOUNTER — Other Ambulatory Visit (HOSPITAL_COMMUNITY): Payer: Self-pay

## 2024-10-20 ENCOUNTER — Ambulatory Visit
Admission: RE | Admit: 2024-10-20 | Discharge: 2024-10-20 | Disposition: A | Discharge status: Home / Self Care | Source: Ambulatory Visit | Attending: Family Medicine | Admitting: Family Medicine

## 2024-10-20 ENCOUNTER — Ambulatory Visit

## 2024-10-20 DIAGNOSIS — R921 Mammographic calcification found on diagnostic imaging of breast: Secondary | ICD-10-CM

## 2024-10-20 DIAGNOSIS — Z09 Encounter for follow-up examination after completed treatment for conditions other than malignant neoplasm: Secondary | ICD-10-CM

## 2024-10-24 ENCOUNTER — Other Ambulatory Visit (HOSPITAL_COMMUNITY): Payer: Self-pay

## 2024-10-25 ENCOUNTER — Other Ambulatory Visit (HOSPITAL_COMMUNITY): Payer: Self-pay

## 2024-10-30 ENCOUNTER — Other Ambulatory Visit (HOSPITAL_COMMUNITY): Payer: Self-pay

## 2024-11-10 DIAGNOSIS — M1990 Unspecified osteoarthritis, unspecified site: Secondary | ICD-10-CM | POA: Diagnosis not present

## 2024-11-10 DIAGNOSIS — M2550 Pain in unspecified joint: Secondary | ICD-10-CM | POA: Diagnosis not present

## 2024-11-10 DIAGNOSIS — M810 Age-related osteoporosis without current pathological fracture: Secondary | ICD-10-CM | POA: Diagnosis not present

## 2024-11-10 DIAGNOSIS — Z79899 Other long term (current) drug therapy: Secondary | ICD-10-CM | POA: Diagnosis not present

## 2024-11-10 DIAGNOSIS — R760 Raised antibody titer: Secondary | ICD-10-CM | POA: Diagnosis not present

## 2024-11-10 DIAGNOSIS — M0609 Rheumatoid arthritis without rheumatoid factor, multiple sites: Secondary | ICD-10-CM | POA: Diagnosis not present

## 2024-11-10 DIAGNOSIS — L659 Nonscarring hair loss, unspecified: Secondary | ICD-10-CM | POA: Diagnosis not present

## 2024-11-10 DIAGNOSIS — Z23 Encounter for immunization: Secondary | ICD-10-CM | POA: Diagnosis not present

## 2024-11-11 ENCOUNTER — Other Ambulatory Visit (HOSPITAL_COMMUNITY): Payer: Self-pay

## 2024-11-12 ENCOUNTER — Other Ambulatory Visit: Payer: Self-pay

## 2024-11-12 ENCOUNTER — Other Ambulatory Visit (HOSPITAL_COMMUNITY): Payer: Self-pay

## 2024-11-12 MED ORDER — ERGOCALCIFEROL 1.25 MG (50000 UT) PO CAPS
50000.0000 [IU] | ORAL_CAPSULE | ORAL | 0 refills | Status: DC
  Filled 2024-11-12: qty 12, 84d supply, fill #0

## 2024-11-19 ENCOUNTER — Other Ambulatory Visit (HOSPITAL_COMMUNITY): Payer: Self-pay

## 2024-12-01 ENCOUNTER — Other Ambulatory Visit (HOSPITAL_COMMUNITY): Payer: Self-pay

## 2024-12-01 MED ORDER — METHOTREXATE SODIUM 2.5 MG PO TABS
10.0000 mg | ORAL_TABLET | ORAL | 0 refills | Status: AC
  Filled 2024-12-01 – 2024-12-02 (×2): qty 48, 84d supply, fill #0

## 2024-12-02 ENCOUNTER — Other Ambulatory Visit: Payer: Self-pay

## 2024-12-02 ENCOUNTER — Other Ambulatory Visit (HOSPITAL_COMMUNITY): Payer: Self-pay

## 2024-12-03 ENCOUNTER — Other Ambulatory Visit (HOSPITAL_COMMUNITY): Payer: Self-pay

## 2024-12-27 ENCOUNTER — Other Ambulatory Visit (HOSPITAL_COMMUNITY): Payer: Self-pay

## 2024-12-27 ENCOUNTER — Other Ambulatory Visit: Payer: Self-pay

## 2024-12-27 MED ORDER — ALENDRONATE SODIUM 70 MG PO TABS
ORAL_TABLET | ORAL | 2 refills | Status: AC
  Filled 2024-12-27: qty 4, 28d supply, fill #0
  Filled 2025-01-20: qty 4, 28d supply, fill #1

## 2025-01-15 ENCOUNTER — Other Ambulatory Visit (HOSPITAL_COMMUNITY): Payer: Self-pay

## 2025-01-17 ENCOUNTER — Other Ambulatory Visit (HOSPITAL_COMMUNITY): Payer: Self-pay

## 2025-01-17 ENCOUNTER — Other Ambulatory Visit: Payer: Self-pay

## 2025-01-20 ENCOUNTER — Other Ambulatory Visit (HOSPITAL_COMMUNITY): Payer: Self-pay

## 2025-01-26 ENCOUNTER — Other Ambulatory Visit (HOSPITAL_COMMUNITY): Payer: Self-pay

## 2025-01-27 ENCOUNTER — Other Ambulatory Visit (HOSPITAL_COMMUNITY): Payer: Self-pay

## 2025-01-27 ENCOUNTER — Other Ambulatory Visit: Payer: Self-pay

## 2025-01-27 MED ORDER — VITAMIN D (ERGOCALCIFEROL) 1.25 MG (50000 UNIT) PO CAPS
50000.0000 [IU] | ORAL_CAPSULE | ORAL | 0 refills | Status: AC
  Filled 2025-01-27: qty 12, 84d supply, fill #0

## 2025-01-28 ENCOUNTER — Other Ambulatory Visit: Payer: Self-pay
